# Patient Record
Sex: Female | Born: 1960 | Race: White | Hispanic: No | Marital: Married
Health system: Midwestern US, Community
[De-identification: ages and names within clinical notes are randomized; demographics above are authoritative.]

## PROBLEM LIST (undated history)

## (undated) DIAGNOSIS — I10 Essential (primary) hypertension: Secondary | ICD-10-CM

## (undated) DIAGNOSIS — J45909 Unspecified asthma, uncomplicated: Secondary | ICD-10-CM

## (undated) DIAGNOSIS — Z1239 Encounter for other screening for malignant neoplasm of breast: Secondary | ICD-10-CM

## (undated) DIAGNOSIS — J4530 Mild persistent asthma, uncomplicated: Principal | ICD-10-CM

## (undated) HISTORY — PX: TONSILLECTOMY: SUR1361

## (undated) HISTORY — PX: UTERINE FIBROID SURGERY: SHX826

---

## 2013-08-08 DIAGNOSIS — I10 Essential (primary) hypertension: Secondary | ICD-10-CM | POA: Insufficient documentation

## 2014-04-04 ENCOUNTER — Emergency Department (HOSPITAL_COMMUNITY)
Admission: EM | Admit: 2014-04-04 | Discharge: 2014-04-04 | Disposition: A | Discharge status: Home / Self Care | Payer: Self-pay | Attending: Emergency Medicine | Admitting: Emergency Medicine

## 2014-04-04 ENCOUNTER — Encounter (HOSPITAL_COMMUNITY): Payer: Self-pay | Admitting: Emergency Medicine

## 2014-04-04 ENCOUNTER — Emergency Department (HOSPITAL_COMMUNITY): Payer: Self-pay

## 2014-04-04 DIAGNOSIS — J45909 Unspecified asthma, uncomplicated: Secondary | ICD-10-CM | POA: Insufficient documentation

## 2014-04-04 DIAGNOSIS — R569 Unspecified convulsions: Secondary | ICD-10-CM

## 2014-04-04 DIAGNOSIS — I10 Essential (primary) hypertension: Secondary | ICD-10-CM | POA: Insufficient documentation

## 2014-04-04 DIAGNOSIS — Z3202 Encounter for pregnancy test, result negative: Secondary | ICD-10-CM | POA: Insufficient documentation

## 2014-04-04 DIAGNOSIS — G40909 Epilepsy, unspecified, not intractable, without status epilepticus: Secondary | ICD-10-CM | POA: Insufficient documentation

## 2014-04-04 DIAGNOSIS — Z79899 Other long term (current) drug therapy: Secondary | ICD-10-CM | POA: Insufficient documentation

## 2014-04-04 DIAGNOSIS — R51 Headache: Secondary | ICD-10-CM | POA: Insufficient documentation

## 2014-04-04 HISTORY — DX: Unspecified asthma, uncomplicated: J45.909

## 2014-04-04 HISTORY — DX: Essential (primary) hypertension: I10

## 2014-04-04 LAB — URINALYSIS, ROUTINE W REFLEX MICROSCOPIC
BILIRUBIN URINE: NEGATIVE
Glucose, UA: NEGATIVE mg/dL
HGB URINE DIPSTICK: NEGATIVE
KETONES UR: NEGATIVE mg/dL
Leukocytes, UA: NEGATIVE
NITRITE: NEGATIVE
Protein, ur: NEGATIVE mg/dL
SPECIFIC GRAVITY, URINE: 1.016 (ref 1.005–1.030)
UROBILINOGEN UA: 0.2 mg/dL (ref 0.0–1.0)
pH: 6 (ref 5.0–8.0)

## 2014-04-04 LAB — COMPREHENSIVE METABOLIC PANEL
ALT: 17 U/L (ref 0–35)
ANION GAP: 14 (ref 5–15)
AST: 20 U/L (ref 0–37)
Albumin: 4.1 g/dL (ref 3.5–5.2)
Alkaline Phosphatase: 90 U/L (ref 39–117)
BILIRUBIN TOTAL: 0.3 mg/dL (ref 0.3–1.2)
BUN: 12 mg/dL (ref 6–23)
CHLORIDE: 98 meq/L (ref 96–112)
CO2: 28 meq/L (ref 19–32)
Calcium: 9.5 mg/dL (ref 8.4–10.5)
Creatinine, Ser: 0.73 mg/dL (ref 0.50–1.10)
Glucose, Bld: 115 mg/dL — ABNORMAL HIGH (ref 70–99)
Potassium: 3.3 mEq/L — ABNORMAL LOW (ref 3.7–5.3)
SODIUM: 140 meq/L (ref 137–147)
Total Protein: 7.6 g/dL (ref 6.0–8.3)

## 2014-04-04 LAB — CBC WITH DIFFERENTIAL/PLATELET
Basophils Absolute: 0 10*3/uL (ref 0.0–0.1)
Basophils Relative: 0 % (ref 0–1)
EOS ABS: 0 10*3/uL (ref 0.0–0.7)
Eosinophils Relative: 0 % (ref 0–5)
HCT: 43.6 % (ref 36.0–46.0)
HEMOGLOBIN: 14.6 g/dL (ref 12.0–15.0)
LYMPHS ABS: 1 10*3/uL (ref 0.7–4.0)
LYMPHS PCT: 12 % (ref 12–46)
MCH: 28.7 pg (ref 26.0–34.0)
MCHC: 33.5 g/dL (ref 30.0–36.0)
MCV: 85.8 fL (ref 78.0–100.0)
MONOS PCT: 5 % (ref 3–12)
Monocytes Absolute: 0.4 10*3/uL (ref 0.1–1.0)
NEUTROS ABS: 6.7 10*3/uL (ref 1.7–7.7)
NEUTROS PCT: 83 % — AB (ref 43–77)
PLATELETS: 275 10*3/uL (ref 150–400)
RBC: 5.08 MIL/uL (ref 3.87–5.11)
RDW: 13.4 % (ref 11.5–15.5)
WBC: 8.2 10*3/uL (ref 4.0–10.5)

## 2014-04-04 LAB — CK: Total CK: 153 U/L (ref 7–177)

## 2014-04-04 LAB — RAPID URINE DRUG SCREEN, HOSP PERFORMED
Amphetamines: NOT DETECTED
BARBITURATES: NOT DETECTED
Benzodiazepines: NOT DETECTED
Cocaine: NOT DETECTED
Opiates: NOT DETECTED
TETRAHYDROCANNABINOL: NOT DETECTED

## 2014-04-04 LAB — POC URINE PREG, ED: Preg Test, Ur: NEGATIVE

## 2014-04-04 MED ORDER — MORPHINE SULFATE 4 MG/ML IJ SOLN
4.0000 mg | Freq: Once | INTRAMUSCULAR | Status: DC
  Filled 2014-04-04: qty 1

## 2014-04-04 MED ORDER — ONDANSETRON HCL 4 MG/2ML IJ SOLN
4.0000 mg | Freq: Once | INTRAMUSCULAR | Status: DC
  Filled 2014-04-04: qty 2

## 2014-04-04 NOTE — ED Notes (Signed)
Pt refused pain medication and nausea medicine. Pt states she is not in enough pain for that.

## 2014-04-04 NOTE — ED Provider Notes (Signed)
CSN: [id number]     Arrival date & time 04/04/14  1600 History   First MD Initiated Contact with Patient 04/04/14 1604     Chief Complaint  Patient presents with  . Seizures     (Consider location/radiation/quality/duration/timing/severity/associated sxs/prior Treatment) HPI Comments: Patient is a 53 year old female with history of hypertension and asthma who presents the emergency department after an episode of seizure-like activity. She reports that she was feeling well this morning and was going about her day normally. She was parked in a parking lot when she suddenly slumped over and had full body convulsions. This was witnessed by a friend. The episode lasted approximately 15 minutes. The patient then remained confused for approximately 15 minutes. She does not remember any details of the event. She reports that she remembers waking up in the ambulance and the EMS personnel was telling her that she was repeating herself. She currently reports a pressure-like headache at the front of her head. She denies any other symptoms. She denies chest pain, shortness of breath,  Dizziness, lightheadedness, numbness, weakness, speech difficulty. Nothing like this has happened to her in the past.  The history is provided by the patient. No language interpreter was used.    Past Medical History  Diagnosis Date  . Hypertension   . Asthma    Past Surgical History  Procedure Laterality Date  . Uterine fibroid surgery     History reviewed. No pertinent family history. History  Substance Use Topics  . Smoking status: Never Smoker   . Smokeless tobacco: Not on file  . Alcohol Use: Yes   OB History    No data available     Review of Systems  Constitutional: Negative for fever and chills.  Respiratory: Negative for shortness of breath.   Cardiovascular: Negative for chest pain.  Gastrointestinal: Negative for nausea, vomiting and abdominal pain.  Neurological: Positive for seizures and  headaches. Negative for tremors and weakness.  All other systems reviewed and are negative.     Allergies  Keflex and Macrolides and ketolides  Home Medications   Prior to Admission medications   Medication Sig Start Date End Date Taking? Authorizing Provider  losartan (COZAAR) 50 MG tablet Take 50 mg by mouth daily. 03/05/14  Yes Historical Provider, MD   BP 154/98 mmHg  Pulse 91  Temp(Src) 97.6 F (36.4 C) (Oral)  Resp 16  Ht 5\' 6"  (1.676 m)  Wt 191 lb (86.637 kg)  BMI 30.84 kg/m2  SpO2 99% Physical Exam  Constitutional: She is oriented to person, place, and time. She appears well-developed and well-nourished. No distress.  HENT:  Head: Normocephalic and atraumatic.  Right Ear: External ear normal.  Left Ear: External ear normal.  Nose: Nose normal.  Mouth/Throat: Oropharynx is clear and moist.  No broken or loose teeth No tongue laceration  Eyes: Conjunctivae and EOM are normal. Pupils are equal, round, and reactive to light.  Neck: Normal range of motion.  Cardiovascular: Normal rate, regular rhythm and normal heart sounds.   Pulmonary/Chest: Effort normal and breath sounds normal. No stridor. No respiratory distress. She has no wheezes. She has no rales.  Abdominal: Soft. She exhibits no distension. There is no tenderness.  Musculoskeletal: Normal range of motion.  Neurological: She is alert and oriented to person, place, and time. She has normal strength. GCS eye subscore is 4. GCS verbal subscore is 5. GCS motor subscore is 6.  Finger-nose-finger normal  normal gait Grip strength 5 out of 5 bilaterally  Skin: Skin is warm and dry. She is not diaphoretic. No erythema.  Psychiatric: She has a normal mood and affect. Her behavior is normal.  Nursing note and vitals reviewed.   ED Course  Procedures (including critical care time) Labs Review Labs Reviewed  CBC WITH DIFFERENTIAL - Abnormal; Notable for the following:    Neutrophils Relative % 83 (*)    All  other components within normal limits  COMPREHENSIVE METABOLIC PANEL - Abnormal; Notable for the following:    Potassium 3.3 (*)    Glucose, Bld 115 (*)    All other components within normal limits  URINALYSIS, ROUTINE W REFLEX MICROSCOPIC  URINE RAPID DRUG SCREEN (HOSP PERFORMED)  CK  POC URINE PREG, ED    Imaging Review Ct Head Wo Contrast  04/04/2014   CLINICAL DATA:  Confusion.  Seizures.  EXAM: CT HEAD WITHOUT CONTRAST  TECHNIQUE: Contiguous axial images were obtained from the base of the skull through the vertex without intravenous contrast.  COMPARISON:  None.  FINDINGS: The brainstem, cerebellum, cerebral peduncles, thalamus, basal ganglia, basilar cisterns, and ventricular system appear within normal limits. No intracranial hemorrhage, mass lesion, or acute CVA. Polypoid mucoperiosteal thickening of the right sphenoid sinus.  IMPRESSION: 1. Chronic right sphenoid sinusitis. Otherwise, no significant abnormalities are observed.   Electronically Signed   By: Herbie BaltimoreWalt  Liebkemann M.D.   On: 04/04/2014 17:45      EKG Interpretation   Date/Time:  Wednesday April 04 2014 16:17:05 EST Ventricular Rate:  112 PR Interval:  163 QRS Duration: 81 QT Interval:  338 QTC Calculation: 461 R Axis:   -22 Text Interpretation:  Sinus tachycardia LAE, consider biatrial enlargement  Borderline left axis deviation No old tracing to compare Confirmed by  Mirian MoGentry, Matthew [phone]([id number]) on 04/04/2014 4:22:43 PM      MDM   Final diagnoses:  Seizure-like activity   Patient presents to ED for seizure like activity. Labs are unremarkable. Head CT shows no acute abnormalities. Discussed case with Dr. Thad Rangereynolds. She reports labs are reassuring that this was not 15 minutes of seizure. Patient with full return to baseline. Will give neurology follow up as an outpatient. Discussed reasons to return to ED immediately. Vital signs stable for discharge. Discussed case with Dr. Littie DeedsGentry who agrees with plan. Patient /  Family / Caregiver informed of clinical course, understand medical decision-making process, and agree with plan.     Mora BellmanHannah S Kimberley Speece, PA-C 04/07/14 [id number]  Mirian MoMatthew Gentry, MD 04/10/14 [phone]

## 2014-04-04 NOTE — Discharge Instructions (Signed)
Do not drive until you have been cleared by an outpatient neurologist.  Seizure, Adult A seizure is abnormal electrical activity in the brain. Seizures usually last from 30 seconds to 2 minutes. There are various types of seizures. Before a seizure, you may have a warning sensation (aura) that a seizure is about to occur. An aura may include the following symptoms:   Fear or anxiety.  Nausea.  Feeling like the room is spinning (vertigo).  Vision changes, such as seeing flashing lights or spots. Common symptoms during a seizure include:  A change in attention or behavior (altered mental status).  Convulsions with rhythmic jerking movements.  Drooling.  Rapid eye movements.  Grunting.  Loss of bladder and bowel control.  Bitter taste in the mouth.  Tongue biting. After a seizure, you may feel confused and sleepy. You may also have an injury resulting from convulsions during the seizure. HOME CARE INSTRUCTIONS   If you are given medicines, take them exactly as prescribed by your health care provider.  Keep all follow-up appointments as directed by your health care provider.  Do not swim or drive or engage in risky activity during which a seizure could cause further injury to you or others until your health care provider says it is OK.  Get adequate rest.  Teach friends and family what to do if you have a seizure. They should:  Lay you on the ground to prevent a fall.  Put a cushion under your head.  Loosen any tight clothing around your neck.  Turn you on your side. If vomiting occurs, this helps keep your airway clear.  Stay with you until you recover.  Know whether or not you need emergency care. SEEK IMMEDIATE MEDICAL CARE IF:  The seizure lasts longer than 5 minutes.  The seizure is severe or you do not wake up immediately after the seizure.  You have an altered mental status after the seizure.  You are having more frequent or worsening  seizures. Someone should drive you to the emergency department or call local emergency services (911 in [place] MAKE SURE YOU:  Understand these instructions.  Will watch your condition.  Will get help right away if you are not doing well or get worse. Document Released: 05/08/2000 Document Revised: 03/01/2013 Document Reviewed: 12/21/2012 [facility] Patient Information 2015 [employer] [place]. This information is not intended to replace advice given to you by your health care provider. Make sure you discuss any questions you have with your health care provider.

## 2014-04-04 NOTE — ED Notes (Signed)
Pt was in car and slumped over, posturing- some shaking. Pt has no hx of seizures. EMS arrived, pt was fully responsive, pt confused, no neuro deficits otherwise. Pt repeating herself, forgetful, pt has no memory of the episode. Pt does not recall being in the EMS. Pt denies HA. BP 190/100-, HR 120, CBG 112. EKG sinus tach. Global ST depression per EMS. Pt now alert, forgetful, repeating information.

## 2014-05-09 ENCOUNTER — Ambulatory Visit (INDEPENDENT_AMBULATORY_CARE_PROVIDER_SITE_OTHER): Payer: No Typology Code available for payment source | Admitting: Neurology

## 2014-05-09 ENCOUNTER — Encounter: Payer: Self-pay | Admitting: Neurology

## 2014-05-09 VITALS — BP 120/76 | HR 67 | Resp 16 | Ht 66.0 in | Wt 194.0 lb

## 2014-05-09 DIAGNOSIS — R569 Unspecified convulsions: Secondary | ICD-10-CM

## 2014-05-09 NOTE — Patient Instructions (Addendum)
1. MRI brain with and without contrast, seizure protocol 2. Routine EEG 3. MRI Scheduled at [facility] on Tuesday 05/15/14 @ 1:45pm. Please arrive @ 1:15pm.           [address]  [phone]  Seizure Precautions: 1. If medication has been prescribed for you to prevent seizures, take it exactly as directed.  Do not stop taking the medicine without talking to your doctor first, even if you have not had a seizure in a long time.   2. Avoid activities in which a seizure would cause danger to yourself or to others.  Don't operate dangerous machinery, swim alone, or climb in high or dangerous places, such as on ladders, roofs, or girders.  Do not drive unless your doctor says you may.  3. If you have any warning that you may have a seizure, lay down in a safe place where you can't hurt yourself.    4.  No driving for 6 months from last seizure, as per [place] [place].   Please refer to the following link on the Epilepsy Foundation of America's website for more information: http://www.epilepsyfoundation.org/answerplace/Social/driving/drivingu.cfm   5.  Maintain good sleep hygiene.  6.  Contact your doctor if you have any problems that may be related to the medicine you are taking.  7.  Call 911 and bring the patient back to the ED if:        A.  The seizure lasts longer than 5 minutes.       B.  The patient doesn't awaken shortly after the seizure  C.  The patient has new problems such as difficulty seeing, speaking or moving  D.  The patient was injured during the seizure  E.  The patient has a temperature over 102 F (39C)  F.  The patient vomited and now is having trouble breathing

## 2014-05-09 NOTE — Progress Notes (Signed)
NEUROLOGY CONSULTATION NOTE  Destiny Carter MRN: [id number] DOB: 1960-06-25  Referring provider: Dr. Agapito GamesMichael Woodbridge Primary care provider: Dr. Agapito GamesMichael Woodbridge  Reason for consult:  New onset seizure  Dear Dr Faythe DingwallWoodbridge:  Thank you for your kind referral of Destiny Doomngela Baby for consultation of the above symptoms. Although her history is well known to you, please allow me to reiterate it for the purpose of our medical record. The patient was accompanied to the clinic by her husband who also provides collateral information. Records and images were personally reviewed where available.  HISTORY OF PRESENT ILLNESS: This is a very pleasant 53 year old right-handed woman with a history of hypertension, in her usual state of health until 04/04/2014 when she had a witnessed convulsion. She was sitting in a car parked waiting for a friend, speaking to her friend in Spanish (not her native language) when she could not think of the word she wanted to say. She has no further recollection of events, she was told that her eyes went back, face contorted, then she started having a generalized tonic-clonic seizure, banging her right leg on the door. This lasted for 2 minutes then she slumped to the left side. Her friend tried to pull her up then she started having another convulsion. No tongue bite or incontinence. When EMS arrived, she apparently got out of the car, grabbed her bags and walked into the ambulance, but has no recollection of this. She was told she was repeating herself in the ambulance and speaking in [place].  She recalls her right side was weaker after the seizure, but was unsure if this was due to swelling from hitting the door with her right side. Her whole body was sore and right ankle and left knee were swollen from hitting the car. [facility] where CBC, CMP, urine drug screen were unremarkable. I personally reviewed head CT which was normal.  She denies prior symptoms of  word-finding difficulties. She has occasional brief episodes of numbness over the right upper lip lasting a few seconds. She feels she may have had some lapses while driving the week prior, she would notice things passing too quickly or she would be staring at the mirror too long, such that she has avoided driving. Over the past month, she has had 3-4 episodes where she would have a dizzy spinning sensation where she feels she has to bear down. She denies any olfactory/gustatory hallucinations, deja vu, focal numbness/tingling/weakness, myoclonic jerks. Since the seizure, certain types of music give her a "creepy feeling," where she feels a sensation of movement like being in an elevator dropping. This lasts for a few seconds. She has also had brief episodes of nausea 1-2 times daily since the seizure.  She has been having sleeping difficulties for the past 3 months, but since the seizure sleep has improved. She has noticed short-term memory changes, she cannot recall names of people at church. 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 She has chronic neck pain. She wonders about menopausal relation, last period was 1-1/2 years ago. She is also concerned about eating moldy food the day prior to the seizure. She drinks wine 3-4 times a week.   Epilepsy Risk Factors:  She had a normal birth and early development.  There is no history of febrile convulsions, CNS infections such as  meningitis/encephalitis, significant traumatic brain injury, neurosurgical procedures, or family history of seizures.  Laboratory Data:  Lab Results  Component Value Date   WBC 8.2 04/04/2014   HGB 14.6 04/04/2014   HCT 43.6 04/04/2014   MCV 85.8 04/04/2014   PLT 275 04/04/2014     Chemistry      Component Value Date/Time   NA 140 04/04/2014 1659   K 3.3*  04/04/2014 1659   CL 98 04/04/2014 1659   CO2 28 04/04/2014 1659   BUN 12 04/04/2014 1659   CREATININE 0.73 04/04/2014 1659      Component Value Date/Time   CALCIUM 9.5 04/04/2014 1659   ALKPHOS 90 04/04/2014 1659   AST 20 04/04/2014 1659   ALT 17 04/04/2014 1659   BILITOT 0.3 04/04/2014 1659       PAST MEDICAL HISTORY: Past Medical History  Diagnosis Date  . Hypertension   . Asthma     PAST SURGICAL HISTORY: Past Surgical History  Procedure Laterality Date  . Uterine fibroid surgery    . Tonsillectomy      MEDICATIONS: Current Outpatient Prescriptions on File Prior to Visit  Medication Sig Dispense Refill  . diphenhydrAMINE (BENADRYL) 25 MG tablet Take 25 mg by mouth every 6 (six) hours as needed for itching or allergies.     No current facility-administered medications on file prior to visit.    ALLERGIES: Allergies  Allergen Reactions  . Other Anaphylaxis    Chili peppers, has epi pen  . Keflex [Cephalexin] Other (See Comments)  . Macrolides And Ketolides Itching and Rash  . Erythromycin Itching and Rash  . Latex Rash    FAMILY HISTORY: Family History  Problem Relation Age of Onset  . Cancer Father   . Cancer Mother     SOCIAL HISTORY: History   Social History  . Marital Status: Married    Spouse Name: N/A    Number of Children: N/A  . Years of Education: N/A   Occupational History  . Not on file.   Social History Main Topics  . Smoking status: Never Smoker   . Smokeless tobacco: Not on file  . Alcohol Use: Yes     Comment: 3-4 Times weekly  . Drug Use: No  . Sexual Activity: Not on file   Other Topics Concern  . Not on file   Social History Narrative    REVIEW OF SYSTEMS: Constitutional: No fevers, chills, or sweats, no generalized fatigue, change in appetite Eyes: No visual changes, double vision, eye pain Ear, nose and throat: No hearing loss, ear pain, nasal congestion, sore throat Cardiovascular: No chest pain,  palpitations Respiratory:  No shortness of breath at rest or with exertion, wheezes GastrointestinaI: No nausea, vomiting, diarrhea, abdominal pain, fecal incontinence Genitourinary:  No dysuria, urinary retention or frequency Musculoskeletal:  + neck pain,no back pain Integumentary: No rash, pruritus, skin lesions Neurological: as above Psychiatric: No depression, insomnia, anxiety Endocrine: No palpitations, fatigue, diaphoresis, mood swings, change in appetite, change in weight, increased thirst Hematologic/Lymphatic:  No anemia, purpura, petechiae. Allergic/Immunologic: no itchy/runny eyes, nasal congestion, recent allergic reactions, rashes  PHYSICAL EXAM: Filed Vitals:   05/09/14 1240  BP: 120/76  Pulse: 67  Resp: 16   General: No acute distress Head:  Normocephalic/atraumatic Eyes: Fundoscopic exam shows bilateral sharp discs, no vessel changes, exudates, or hemorrhages Neck: supple, no paraspinal tenderness, full range of motion Back: No paraspinal tenderness Heart: regular rate and rhythm Lungs: Clear to auscultation bilaterally. Vascular: No carotid bruits. Skin/Extremities:  No rash, no edema Neurological Exam: Mental status: alert and oriented to person, place, and time, no dysarthria or aphasia, Fund of knowledge is appropriate.  Recent and remote memory are intact.  Attention and concentration are normal.    Able to name objects and repeat phrases. Cranial nerves: CN I: not tested CN II: pupils equal, round and reactive to light, visual fields intact, fundi unremarkable. CN III, IV, VI:  full range of motion, no nystagmus, no ptosis CN V: facial sensation intact CN VII: upper and lower face symmetric CN VIII: hearing intact to finger rub CN IX, X: gag intact, uvula midline CN XI: sternocleidomastoid and trapezius muscles intact CN XII: tongue midline Bulk & Tone: normal, no fasciculations. Motor: 5/5 throughout with no pronator drift. Sensation: intact to light  touch, cold, pin, vibration and joint position sense.  No extinction to double simultaneous stimulation.  Romberg test negative Deep Tendon Reflexes: +2 throughout, no ankle clonus Plantar responses: downgoing bilaterally Cerebellar: no incoordination on finger to nose, heel to shin. No dysdiadochokinesia Gait: narrow-based and steady, able to tandem walk adequately. Tremor: none  IMPRESSION: This is a very pleasant 53 year old right-handed woman with a history of hypertension, with new onset seizure last 04/04/14. She started having word-finding difficulties followed by a convulsion. She recalls some right-sided weakness after. Seizure concerning for partial seizure arising from the left hemisphere. Her neurological exam is normal. MRI brain with and without contrast and routine EEG will be ordered to assess for focal abnormalities that increase risk for recurrent seizures. We discussed that after an initial seizure, unless there are significant risk factors, an abnormal neurological exam, an EEG showing epileptiform abnormalities, and/or abnormal neuroimaging, treatment with an antiepileptic drug is usually not indicated. We discussed 10% of the population may have a single seizure. Patients with a single unprovoked seizure have a recurrence rate of 33% after a single seizure and 73% after a second seizure. If routine EEG normal, she would benefit from prolonged EEG if recurrent symptoms of epigastric sensation and right facial numbness continue, to further classify her symptoms. We discussed [place] driving restrictions which indicate a patient needs to free of seizures or events of altered awareness for 6 months prior to resuming driving. The patient agreed to comply with these restrictions.  Seizure precautions were discussed which include no driving, no bathing in a tub, no swimming alone, no cooking over an open flame, no operating dangerous machinery, and no activities which may endanger oneself or  someone else. She will follow-up after the tests.   Thank you for allowing me to participate in the care of this patient. Please do not hesitate to call for any questions or concerns.   Patrcia DollyKaren Aquino, M.D.  CC: Dr. Faythe DingwallWoodbridge

## 2014-05-11 ENCOUNTER — Telehealth: Payer: Self-pay | Admitting: Neurology

## 2014-05-11 NOTE — Telephone Encounter (Signed)
Pt called/returnign your call from yesterday regarding her EEG results. Pt also wanted to make you aware that she is scheduled to have her MRI on 05/15/14 at 1:15 PM [facility].  C/b [phone]

## 2014-05-11 NOTE — Telephone Encounter (Signed)
Discussed EEG findings showing bilateral temporal slowing, L>R. No epileptiform discharges. Findings are non-specific, indicates focal cerebral dysfunction. Await MRI brain to assess for underlying structural abnormality. She had an episode last night while at church function where she felt a sensation of movement for 30 minutes, no confusion or focal symptoms. Recommend 24-hour EEG for further seizure classification. Will need prior approval per patient.  Tiff, can you pls sched for 24-hour EEG and see what she needs for approval of the test. Thanks!!

## 2014-05-11 NOTE — Telephone Encounter (Signed)
Left VM

## 2014-05-14 ENCOUNTER — Telehealth: Payer: Self-pay | Admitting: *Deleted

## 2014-05-14 ENCOUNTER — Other Ambulatory Visit: Payer: Self-pay | Admitting: Family Medicine

## 2014-05-14 DIAGNOSIS — R569 Unspecified convulsions: Secondary | ICD-10-CM

## 2014-05-14 NOTE — Telephone Encounter (Signed)
Authorization approval form was faxed to us & was put in for scanning.

## 2014-05-14 NOTE — Telephone Encounter (Signed)
Patient scheduled for 24 hour EEG on Wednesday. She would like for you to call her insurance carrier to ,ake sure this has been authorized prior to this testing done. Health care support [phone] Marylene Landngela [phone] Please advise

## 2014-05-14 NOTE — Telephone Encounter (Signed)
[facility] with Tacey RuizLeah. 24 Hour EEG was approved auth #: [id number].

## 2014-05-14 NOTE — Telephone Encounter (Signed)
Ordered placed, printed & taken up front for patient to be called & scheduled.

## 2014-05-15 ENCOUNTER — Ambulatory Visit: Payer: Self-pay | Admitting: Neurology

## 2014-05-15 ENCOUNTER — Encounter: Payer: Self-pay | Admitting: Neurology

## 2014-05-15 NOTE — Procedures (Signed)
ELECTROENCEPHALOGRAM REPORT  Date of Study: 05/09/2014  Patient's Name: Destiny Carter MRN: [id number] Date of Birth: [birth year]  Referring Provider: Dr. Patrcia DollyKaren Matilde Pottenger  Clinical History: This is a 53 year old woman with new onset seizure.  Medications: Cozaar  Technical Summary: A multichannel digital EEG recording measured by the international 10-20 system with electrodes applied with paste and impedances below 5000 ohms performed in our laboratory with EKG monitoring in an awake and asleep patient.  Hyperventilation and photic stimulation were performed.  The digital EEG was referentially recorded, reformatted, and digitally filtered in a variety of bipolar and referential montages for optimal display.    Description: The patient is awake and asleep during the recording.  During maximal wakefulness, there is a symmetric, medium voltage 10-10.5 Hz posterior dominant rhythm that attenuates with eye opening.  There is occasional focal 4-5 Hz theta slowing seen over the bilateral temporal regions, left greater than right, at times sharply contoured without clear epileptogenic potential. During drowsiness and stage I sleep, there is an increase in theta slowing of the background with occasional vertex waves seen.  Hyperventilation and photic stimulation did not elicit any epileptiform abnormalities, with note of increase focal slowing over the bilateral temporal regions.  There were no clear epileptiform discharges or electrographic seizures seen.    EKG lead was unremarkable.  Impression: This awake and asleep EEG is abnormal due to occasional focal slowing over the bilateral temporal regions, left greater than right.  Clinical Correlation of the above findings indicates focal cerebral dysfunction over the bilateral temporal regions suggestive of underlying structural or physiologic abnormality. The absence of epileptiform discharges does not exclude a clinical diagnosis of epilepsy.  If further  clinical questions remain, prolonged EEG may be helpful.  Clinical correlation is advised.   Patrcia DollyKaren Kaeleigh Westendorf, M.D.

## 2014-05-16 ENCOUNTER — Other Ambulatory Visit: Payer: No Typology Code available for payment source

## 2014-05-16 ENCOUNTER — Telehealth: Payer: Self-pay | Admitting: *Deleted

## 2014-05-16 NOTE — Telephone Encounter (Signed)
-----   Message from Van ClinesKaren M Aquino, MD sent at 05/16/2014  9:13 AM EST ----- Regarding: results Pls let her know I reviewed MRI brain and it is normal, no evidence of tumor, stroke, or bleed. Thanks

## 2014-05-16 NOTE — Telephone Encounter (Signed)
Patient notified

## 2014-05-19 ENCOUNTER — Encounter: Payer: Self-pay | Admitting: Neurology

## 2014-05-19 DIAGNOSIS — R569 Unspecified convulsions: Secondary | ICD-10-CM | POA: Insufficient documentation

## 2014-05-19 DIAGNOSIS — I10 Essential (primary) hypertension: Secondary | ICD-10-CM | POA: Insufficient documentation

## 2014-05-21 ENCOUNTER — Telehealth: Payer: Self-pay

## 2014-05-21 ENCOUNTER — Telehealth: Payer: Self-pay | Admitting: Neurology

## 2014-05-21 NOTE — Progress Notes (Signed)
Note faxed.

## 2014-05-21 NOTE — Telephone Encounter (Signed)
Returned call to Destiny Carter with code number and amount billed to insurance. Stated that is what we bill under that code, and that she will need to verify it is covered by her insurance.

## 2014-05-23 ENCOUNTER — Ambulatory Visit (INDEPENDENT_AMBULATORY_CARE_PROVIDER_SITE_OTHER): Payer: No Typology Code available for payment source | Admitting: Neurology

## 2014-05-23 DIAGNOSIS — R569 Unspecified convulsions: Secondary | ICD-10-CM

## 2014-05-28 ENCOUNTER — Telehealth: Payer: Self-pay | Admitting: Neurology

## 2014-05-28 NOTE — Telephone Encounter (Signed)
Discussed EEG results again showing bilateral temporal slowing, left greater than right. No epileptiform discharges. Discussed options, close clinical monitoring off medication versus starting AED (either Keppra or Lamictal, side effects discussed). Patient asked several questions regarding EEG changes, all questions answered. She opts to hold off on AED at this time. Avoid seizure triggers, including sleep deprivation and alcohol. F/u as scheduled.

## 2014-05-28 NOTE — Procedures (Signed)
ELECTROENCEPHALOGRAM REPORT  Dates of Recording: 05/23/2014 to 05/24/2014  Patient's Name: Destiny Carter MRN: [id number] Date of Birth: [birth year]  Referring Provider: Dr. Patrcia Dolly  Procedure: 24-hour ambulatory EEG  History: This is a 54 year old woman with new onset seizure  Medications: Cozaar  Technical Summary: This is a 24-hour multichannel digital EEG recording measured by the international 10-20 system with electrodes applied with paste and impedances below 5000 ohms performed as portable with EKG monitoring.  The digital EEG was referentially recorded, reformatted, and digitally filtered in a variety of bipolar and referential montages for optimal display.    DESCRIPTION OF RECORDING: During maximal wakefulness, the background activity consisted of a symmetric 10 Hz posterior dominant rhythm which was reactive to eye opening.  There was occasional focal 4-5 Hz theta slowing seen over the bilateral temporal regions, left greater than right, at times sharply contoured without clear epileptogenic potential. There were no clear epileptiform discharges seen in wakefulness.  During the recording, the patient progresses through wakefulness, drowsiness, and Stage 2 sleep.  Similar occasional focal slowing was seen over the bilateral temporal regions, left greater than right. Again, there were no epileptiform discharges seen.  Events: There were no push button events.   There were no electrographic seizures seen.  EKG lead was unremarkable.  IMPRESSION: This 24-hour ambulatory EEG study is abnormal due to occasional focal slowing over the bilateral temporal regions, left greater than right.  CLINICAL CORRELATION of the above findings indicates focal cerebral dysfunction over the bilateral temporal regions  suggestive of underlying structural or physiologic abnormality. The absence of epileptiform discharges does not exclude a  clinical diagnosis of epilepsy. Clinical correlation  is advised.   Patrcia Dolly, M.D.

## 2014-06-05 ENCOUNTER — Encounter: Payer: Self-pay | Admitting: Neurology

## 2014-06-11 ENCOUNTER — Ambulatory Visit (INDEPENDENT_AMBULATORY_CARE_PROVIDER_SITE_OTHER): Payer: No Typology Code available for payment source | Admitting: Neurology

## 2014-06-11 ENCOUNTER — Encounter: Payer: Self-pay | Admitting: Neurology

## 2014-06-11 VITALS — BP 128/90 | HR 70 | Resp 16 | Ht 66.0 in | Wt 196.0 lb

## 2014-06-11 DIAGNOSIS — R569 Unspecified convulsions: Secondary | ICD-10-CM

## 2014-06-11 NOTE — Patient Instructions (Addendum)
1. Call our office for any change in symptoms, follow-up in 2 months  Seizure Precautions: 1. If medication has been prescribed for you to prevent seizures, take it exactly as directed.  Do not stop taking the medicine without talking to your doctor first, even if you have not had a seizure in a long time.   2. Avoid activities in which a seizure would cause danger to yourself or to others.  Don't operate dangerous machinery, swim alone, or climb in high or dangerous places, such as on ladders, roofs, or girders.  Do not drive unless your doctor says you may.  3. If you have any warning that you may have a seizure, lay down in a safe place where you can't hurt yourself.    4.  No driving for 6 months from last seizure, [place].   Please refer to the following link on the Epilepsy Foundation of America's website for more information: http://www.epilepsyfoundation.org/answerplace/Social/driving/drivingu.cfm   5.  Maintain good sleep hygiene.  6.  Contact your doctor if you have any problems that may be related to the medicine you are taking.  7.  Call 911 and bring the patient back to the ED if:        A.  The seizure lasts longer than 5 minutes.       B.  The patient doesn't awaken shortly after the seizure  C.  The patient has new problems such as difficulty seeing, speaking or moving  D.  The patient was injured during the seizure  E.  The patient has a temperature over 102 F (39C)  F.  The patient vomited and now is having trouble breathing

## 2014-06-11 NOTE — Progress Notes (Signed)
NEUROLOGY FOLLOW UP OFFICE NOTE  Destiny Carter [id number]  HISTORY OF PRESENT ILLNESS: I had the pleasure of seeing Destiny Carter in follow-up in the neurology clinic on 06/11/2014.  The patient was last seen a month ago for new onset seizure. Records and images were personally reviewed where available.  I personally reviewed MRI brain with and without contrast which was normal, hippocampi symmetric. Her routine and 24-hour EEG showed occasional focal slowing over the bilateral temporal regions, left greater than right, no epileptiform discharges seen. Findings were discussed over the phone, we had discussed starting seizure medication versus close observation, and she opted to stay off medication. She denies any further seizures or seizure-like symptoms. No further speech difficulties, dizziness, or right-sided tingling.   HPI: This is a very pleasant 54 yo RH woman with a history of hypertension, in her usual state of health until 04/04/2014 when she had a witnessed convulsion. She was sitting in a car parked waiting for a friend, speaking to her friend in Spanish (not her native language) when she could not think of the word she wanted to say. She has no further recollection of events, she was told that her eyes went back, face contorted, then she started having a generalized tonic-clonic seizure, banging her right leg on the door. This lasted for 2 minutes then she slumped to the left side. Her friend tried to pull her up then she started having another convulsion. No tongue bite or incontinence. When EMS arrived, she apparently got out of the car, grabbed her bags and walked into the ambulance, but has no recollection of this. She was told she was repeating herself in the ambulance and speaking in [place]. She recalls her right side was weaker after the seizure, but was unsure if this was due to swelling from hitting the door with her right side. Her whole body was sore and right ankle and left knee  were swollen from hitting the car. She was brought to [facility] ER where CBC, CMP, urine drug screen were unremarkable. I personally reviewed head CT which was normal.  Epilepsy Risk Factors: She had a normal birth and early development. There is no history of febrile convulsions, CNS infections such as meningitis/encephalitis, significant traumatic brain injury, neurosurgical procedures, or family history of seizures.  PAST MEDICAL HISTORY: Past Medical History  Diagnosis Date  . Hypertension   . Asthma     MEDICATIONS: Current Outpatient Prescriptions on File Prior to Visit  Medication Sig Dispense Refill  . diphenhydrAMINE (BENADRYL) 25 MG tablet Take 25 mg by mouth every 6 (six) hours as needed for itching or allergies.    Marland Kitchen EPIPEN 2-PAK 0.3 MG/0.3ML SOAJ injection PRN  3  . levalbuterol (XOPENEX HFA) 45 MCG/ACT inhaler Inhale into the lungs. Every 4 hrs prn    . losartan (COZAAR) 100 MG tablet Take 100 mg by mouth. Take 1 tablet daily    . MULTIPLE VITAMIN PO Take by mouth daily.     No current facility-administered medications on file prior to visit.    ALLERGIES: Allergies  Allergen Reactions  . Other Anaphylaxis    Chili peppers, has epi pen  . Keflex [Cephalexin] Other (See Comments)  . Macrolides And Ketolides Itching and Rash  . Erythromycin Itching and Rash  . Latex Rash    FAMILY HISTORY: Family History  Problem Relation Age of Onset  . Cancer Father   . Cancer Mother     SOCIAL HISTORY: History   Social History  .  Marital Status: Married    Spouse Name: N/A    Number of Children: N/A  . Years of Education: N/A   Occupational History  . Not on file.   Social History Main Topics  . Smoking status: Never Smoker   . Smokeless tobacco: Not on file  . Alcohol Use: Yes     Comment: 3-4 Times weekly  . Drug Use: No  . Sexual Activity: Not on file   Other Topics Concern  . Not on file   Social History Narrative    REVIEW OF SYSTEMS: Constitutional:  No fevers, chills, or sweats, no generalized fatigue, change in appetite Eyes: No visual changes, double vision, eye pain Ear, nose and throat: No hearing loss, ear pain, nasal congestion, sore throat Cardiovascular: No chest pain, palpitations Respiratory:  No shortness of breath at rest or with exertion, wheezes GastrointestinaI: No nausea, vomiting, diarrhea, abdominal pain, fecal incontinence Genitourinary:  No dysuria, urinary retention or frequency Musculoskeletal:  No neck pain, back pain Integumentary: No rash, pruritus, skin lesions Neurological: as above Psychiatric: No depression, insomnia, anxiety Endocrine: No palpitations, fatigue, diaphoresis, mood swings, change in appetite, change in weight, increased thirst Hematologic/Lymphatic:  No anemia, purpura, petechiae. Allergic/Immunologic: no itchy/runny eyes, nasal congestion, recent allergic reactions, rashes  PHYSICAL EXAM: Filed Vitals:   06/11/14 1029  BP: 128/90  Pulse: 70  Resp: 16   General: No acute distress Head:  Normocephalic/atraumatic Neck: supple, no paraspinal tenderness, full range of motion Heart:  Regular rate and rhythm Lungs:  Clear to auscultation bilaterally Back: No paraspinal tenderness Skin/Extremities: No rash, no edema Neurological Exam: alert and oriented to person, place, and time. No aphasia or dysarthria. Fund of knowledge is appropriate.  Recent and remote memory are intact.  3/3 delayed recall. Attention and concentration are normal.    Able to name objects and repeat phrases. Cranial nerves: Pupils equal, round, reactive to light.  Fundoscopic exam unremarkable, no papilledema. Extraocular movements intact with no nystagmus. Visual fields full. Facial sensation intact. No facial asymmetry. Tongue, uvula, palate midline.  Motor: Bulk and tone normal, muscle strength 5/5 throughout with no pronator drift.  Sensation to light touch intact.  No extinction to double simultaneous stimulation.  Deep  tendon reflexes 2+ throughout, toes downgoing.  Finger to nose testing intact.  Gait narrow-based and steady, able to tandem walk adequately.  Romberg negative.  IMPRESSION: This is a very pleasant 54 yo RH woman with a history of hypertension, with new onset seizure last 04/04/14. She started having word-finding difficulties followed by a convulsion. She recalls some right-sided weakness after. Seizure concerning for partial seizure arising from the left hemisphere. Her neurological exam is normal. MRI brain normal. Her routine and 24-hour EEG show occasional focal slowing over the bilateral temporal regions, left greater than right, no epileptiform discharges. Although EEG did not show epileptiform discharges, with the history suggestive of partial onset seizure possibly from the left hemisphere, I discussed the option of starting anti-seizure medication. We discussed options, including Lamictal and Keppra, as well as side effects. She would like to hold off for now and continue to monitor symptoms. She is aware of [place] driving restrictions which indicate a patient needs to free of seizures or events of altered awareness for 6 months prior to resuming driving. She had several questions regarding the diagnosis, test results, and prognosis, which were all answered today. She will follow-up in 2 months and knows to call our office for any change in symptoms.   Thank  you for allowing me to participate in her care.  Please do not hesitate to call for any questions or concerns.  The duration of this appointment visit was 25 minutes of face-to-face time with the patient.  Greater than 50% of this time was spent in counseling, explanation of diagnosis, planning of further management, and coordination of care.   Patrcia DollyKaren Malasia Torain, M.D.   CC: Dr. Faythe DingwallWoodbridge

## 2014-06-16 ENCOUNTER — Encounter: Payer: Self-pay | Admitting: Neurology

## 2014-06-18 NOTE — Progress Notes (Signed)
Done

## 2014-07-03 NOTE — Telephone Encounter (Signed)
Her appointment was changed to a more convenient time for her and all the information for insurance was given to her this visit was finished successfully and to the patient's satisfaction.

## 2014-08-06 ENCOUNTER — Ambulatory Visit: Payer: No Typology Code available for payment source | Admitting: Neurology

## 2014-08-13 ENCOUNTER — Telehealth: Payer: Self-pay | Admitting: Neurology

## 2014-08-13 NOTE — Telephone Encounter (Signed)
I spoke with patient. She has to have a tooth extracted, she wanted to make sure it would ok for her to be put under with the hx of the seizure she had. Per Dr. Karel JarvisAquino it's ok for her to have dental procedure as planned. She will call us back to get her follow-up rescheduled, which she had to cancel last week.

## 2014-08-13 NOTE — Telephone Encounter (Signed)
Pt called wanting to ask Dr. Allena KatzPatel a questions...the patient saids that she needs to have dental surgery and wants to see if she can have it done. C/b [phone]

## 2014-10-02 ENCOUNTER — Ambulatory Visit (INDEPENDENT_AMBULATORY_CARE_PROVIDER_SITE_OTHER): Payer: No Typology Code available for payment source | Admitting: Neurology

## 2014-10-02 ENCOUNTER — Encounter: Payer: Self-pay | Admitting: Neurology

## 2014-10-02 VITALS — BP 124/86 | HR 72 | Resp 16 | Ht 66.0 in | Wt 197.0 lb

## 2014-10-02 DIAGNOSIS — G40009 Localization-related (focal) (partial) idiopathic epilepsy and epileptic syndromes with seizures of localized onset, not intractable, without status epilepticus: Secondary | ICD-10-CM

## 2014-10-02 MED ORDER — LAMOTRIGINE ER 25 MG PO TB24: ORAL_TABLET | ORAL | Status: DC

## 2014-10-02 NOTE — Patient Instructions (Signed)
1. Start Lamotrigine ER 25mg : take 1 tablet daily for 2 weeks, then increase to 2 tablets daily for 2 weeks, then increase to 4 tablets daily 2. Call our office for any problems 3. Follow-up in 6 weeks  Seizure Precautions: 1. If medication has been prescribed for you to prevent seizures, take it exactly as directed.  Do not stop taking the medicine without talking to your doctor first, even if you have not had a seizure in a long time.   2. Avoid activities in which a seizure would cause danger to yourself or to others.  Don't operate dangerous machinery, swim alone, or climb in high or dangerous places, such as on ladders, roofs, or girders.  Do not drive unless your doctor says you may.  3. If you have any warning that you may have a seizure, lay down in a safe place where you can't hurt yourself.    4.  No driving for 6 months from last seizure, [place].   Please refer to the following link on the Epilepsy Foundation of America's website for more information: http://www.epilepsyfoundation.org/answerplace/Social/driving/drivingu.cfm   5.  Maintain good sleep hygiene.  6.  Contact your doctor if you have any problems that may be related to the medicine you are taking.  7.  Call 911 and bring the patient back to the ED if:        A.  The seizure lasts longer than 5 minutes.       B.  The patient doesn't awaken shortly after the seizure  C.  The patient has new problems such as difficulty seeing, speaking or moving  D.  The patient was injured during the seizure  E.  The patient has a temperature over 102 F (39C)  F.  The patient vomited and now is having trouble breathing

## 2014-10-02 NOTE — Progress Notes (Signed)
NEUROLOGY FOLLOW UP OFFICE NOTE  Destiny Carter [id number]  HISTORY OF PRESENT ILLNESS: I had the pleasure of seeing Destiny Carter in follow-up in the neurology clinic on 10/02/2014.  The patient was last seen 4 months ago and is again accompanied by her husband today. Since her last visit, she has had another seizure last 09/12/2014. She had decided to hold off on starting anti-epileptic medication after the first seizure. Her MRI brain was normal, however 24-hour EEG had shown bilateral temporal slowing, left>right, no epileptiform discharges. She states that she was feeling well, sitting in the passenger seat of a car, when she stopped talking mid-sentence and turned to her husband, stating "I am going to have a seizure." She felt she could not verbalize, and saw bright lights like she was passing through trees, then lost awareness. Her husband reports she became quiet, then moaned, her right arm went straight up, followed by a generalized convulsion lasting 5 minutes. She was drooling after and not making sense. No focal weakness noted, she walked out of the car. When she regained awareness, she had a terrible headache and felt very tired and sleepy. She is concerned about itching on her upper left lip the day prior that lasted the whole day. Last weekend, she had 2 episodes of a brief electric shock sensation with numbness down her left side, "felt like a cattle prod," lasting 2-3 seconds, no associated confusion. Since the seizure, she feels slow and feels her memory has worsened.  She denies any further headaches. No dizziness, focal numbness/tingling/weakness, olfactory/gustatory hallucinations. She has some eczema in the back of her neck (plaque-like lesions).   HPI: This is a very pleasant 54 yo RH woman with a history of hypertension, in her usual state of health until 04/04/2014 when she had a witnessed convulsion. She was sitting in a car parked waiting for a friend, speaking to her friend  in Spanish (not her native language) when she could not think of the word she wanted to say. She has no further recollection of events, she was told that her eyes went back, face contorted, then she started having a generalized tonic-clonic seizure, banging her right leg on the door. This lasted for 2 minutes then she slumped to the left side. Her friend tried to pull her up then she started having another convulsion. No tongue bite or incontinence. When EMS arrived, she apparently got out of the car, grabbed her bags and walked into the ambulance, but has no recollection of this. She was told she was repeating herself in the ambulance and speaking in [place]. She recalls her right side was weaker after the seizure, but was unsure if this was due to swelling from hitting the door with her right side. Her whole body was sore and right ankle and left knee were swollen from hitting the car. She was brought to [facility] ER where CBC, CMP, urine drug screen were unremarkable. I personally reviewed head CT which was normal.  Epilepsy Risk Factors: She had a normal birth and early development. There is no history of febrile convulsions, CNS infections such as meningitis/encephalitis, significant traumatic brain injury, neurosurgical procedures, or family history of seizures.  Diagnostic data: I personally reviewed MRI brain with and without contrast which was normal, hippocampi symmetric. Her routine and 24-hour EEG showed occasional focal slowing over the bilateral temporal regions, left greater than right, no epileptiform discharges seen. Typical events not captured.  PAST MEDICAL HISTORY: Past Medical History  Diagnosis Date  .  Hypertension   . Asthma     MEDICATIONS: Current Outpatient Prescriptions on File Prior to Visit  Medication Sig Dispense Refill  . diphenhydrAMINE (BENADRYL) 25 MG tablet Take 25 mg by mouth every 6 (six) hours as needed for itching or allergies.    Marland Kitchen. levalbuterol (XOPENEX HFA) 45  MCG/ACT inhaler Inhale into the lungs. Every 4 hrs prn    . losartan (COZAAR) 100 MG tablet Take 100 mg by mouth. Take 1 tablet daily    . MULTIPLE VITAMIN PO Take by mouth daily.    Marland Kitchen. EPIPEN 2-PAK 0.3 MG/0.3ML SOAJ injection PRN  3   No current facility-administered medications on file prior to visit.    ALLERGIES: Allergies  Allergen Reactions  . Other Anaphylaxis    Chili peppers, has epi pen  . Keflex [Cephalexin] Other (See Comments)  . Macrolides And Ketolides Itching and Rash  . Erythromycin Itching and Rash  . Latex Rash    FAMILY HISTORY: Family History  Problem Relation Age of Onset  . Cancer Father   . Cancer Mother     SOCIAL HISTORY: History   Social History  . Marital Status: Married    Spouse Name: N/A  . Number of Children: N/A  . Years of Education: N/A   Occupational History  . Not on file.   Social History Main Topics  . Smoking status: Never Smoker   . Smokeless tobacco: Not on file  . Alcohol Use: Yes     Comment: 3-4 Times weekly  . Drug Use: No  . Sexual Activity: Not on file   Other Topics Concern  . Not on file   Social History Narrative    REVIEW OF SYSTEMS: Constitutional: No fevers, chills, or sweats, no generalized fatigue, change in appetite Eyes: No visual changes, double vision, eye pain Ear, nose and throat: No hearing loss, ear pain, nasal congestion, sore throat Cardiovascular: No chest pain, palpitations Respiratory:  No shortness of breath at rest or with exertion, wheezes GastrointestinaI: No nausea, vomiting, diarrhea, abdominal pain, fecal incontinence Genitourinary:  No dysuria, urinary retention or frequency Musculoskeletal:  No neck pain, back pain Integumentary: No rash, pruritus, skin lesions Neurological: as above Psychiatric: No depression, insomnia, anxiety Endocrine: No palpitations, fatigue, diaphoresis, mood swings, change in appetite, change in weight, increased thirst Hematologic/Lymphatic:  No  anemia, purpura, petechiae. Allergic/Immunologic: no itchy/runny eyes, nasal congestion, recent allergic reactions, rashes  PHYSICAL EXAM: Filed Vitals:   10/02/14 0959  BP: 124/86  Pulse: 72  Resp: 16   General: No acute distress Head:  Normocephalic/atraumatic Neck: supple, no paraspinal tenderness, full range of motion Heart:  Regular rate and rhythm Lungs:  Clear to auscultation bilaterally Back: No paraspinal tenderness Skin/Extremities: No rash, no edema Neurological Exam: alert and oriented to person, place, and time. No aphasia or dysarthria. Fund of knowledge is appropriate.  Recent and remote memory are intact. 3/3 delayed recall.  Attention and concentration are normal.    Able to name objects and repeat phrases. Cranial nerves: Pupils equal, round, reactive to light.  Fundoscopic exam unremarkable, no papilledema. Extraocular movements intact with no nystagmus. Visual fields full. Facial sensation intact. No facial asymmetry. Tongue, uvula, palate midline.  Motor: Bulk and tone normal, muscle strength 5/5 throughout with no pronator drift.  Sensation to light touch intact.  No extinction to double simultaneous stimulation.  Deep tendon reflexes 2+ throughout, toes downgoing.  Finger to nose testing intact.  Gait narrow-based and steady, able to tandem walk adequately.  Romberg negative.  IMPRESSION: This is a very pleasant 54 yo RH woman with a history of hypertension, with new onset seizure last 04/04/14. She started having word-finding difficulties followed by a convulsion. She recalls some right-sided weakness after. Seizure concerning for partial seizure arising from the left hemisphere. Her neurological exam and MRI brain are normal. Her routine and 24-hour EEG show occasional focal slowing over the bilateral temporal regions, left greater than right, no epileptiform discharges. Although EEG did not show epileptiform discharges, with the history suggestive of partial onset  seizure possibly from the left hemisphere, I discussed the option of starting anti-seizure medication. She decided to hold off after the first seizure, however after this second seizure last 09/22/14 again with speech arrest and right arm extension, she is agreeable to starting AED. She will start Lamictal uptitration, side effects, including risk of Levonne Spiller syndrome was discussed. She is aware of  driving laws to stop driving until 6 months seizure-free. She will follow-up in 6 weeks and knows to call our office for any changes.   Thank you for allowing me to participate in her care.  Please do not hesitate to call for any questions or concerns.  The duration of this appointment visit was 25 minutes of face-to-face time with the patient.  Greater than 50% of this time was spent in counseling, explanation of diagnosis, planning of further management, and coordination of care.   Patrcia Dolly, M.D.   CC: Dr. Faythe Dingwall

## 2014-10-06 DIAGNOSIS — G40009 Localization-related (focal) (partial) idiopathic epilepsy and epileptic syndromes with seizures of localized onset, not intractable, without status epilepticus: Secondary | ICD-10-CM | POA: Insufficient documentation

## 2014-11-19 ENCOUNTER — Ambulatory Visit (INDEPENDENT_AMBULATORY_CARE_PROVIDER_SITE_OTHER): Payer: No Typology Code available for payment source | Admitting: Neurology

## 2014-11-19 ENCOUNTER — Encounter: Payer: Self-pay | Admitting: Neurology

## 2014-11-19 VITALS — BP 122/82 | HR 76 | Resp 16 | Ht 66.0 in | Wt 194.0 lb

## 2014-11-19 DIAGNOSIS — G40009 Localization-related (focal) (partial) idiopathic epilepsy and epileptic syndromes with seizures of localized onset, not intractable, without status epilepticus: Secondary | ICD-10-CM

## 2014-11-19 MED ORDER — LAMOTRIGINE ER 100 MG PO TB24
ORAL_TABLET | ORAL | Status: DC
Start: 2014-11-19 — End: 2015-01-21

## 2014-11-19 NOTE — Progress Notes (Signed)
NEUROLOGY FOLLOW UP OFFICE NOTE  Destiny Carter [id number]  HISTORY OF PRESENT ILLNESS: I had the pleasure of seeing Destiny Carter in follow-up in the neurology clinic on 11/19/2014.  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 She is again accompanied by her husband who supplements the history today. She has had 2 seizures, most recently on 09/12/14. She is now on Lamictal XR /day with no side effects. She has several concerns which we went through. She has noticed some redness in her cheeks and chin, non-pruritic, no rash over the chest/arms/trunk/abdomen. She has had 2 episodes of memory loss, 3 weeks ago they were sitting and talking in the parking lot of [employer], then later on asked when they were going to [employer]. A week ago, she handed a friend the pencil she asked for, and her friend told her she just gave it to her. She had no recall that these had previously occurred until she was reminded of them later. Her husband denied any confusion or speech difficulties while they were sitting in the car. He denies any staring or unresponsive episodes, she denies any speech difficulties. She denies any headaches, dizziness, diplopia, focal numbness/tingling/weakness. She feels her balance is off, no falls.   HPI: This is a very pleasant 54 yo RH woman with a history of hypertension, in her usual state of health until 04/04/2014 when she had a witnessed convulsion. She was sitting in a car parked waiting for a friend, speaking to her friend in Spanish (not her native language) when she could not think of the word she wanted to say. She has no further recollection of events, she was told that her eyes went back, face contorted, then she started having a generalized tonic-clonic seizure, banging her right leg on the door. This lasted for 2 minutes then she slumped to the left side. Her friend tried to pull her up then she started  having another convulsion. No tongue bite or incontinence. When EMS arrived, she apparently got out of the car, grabbed her bags and walked into the ambulance, but has no recollection of this. She was told she was repeating herself in the ambulance and speaking in [place]. She recalls her right side was weaker after the seizure, but was unsure if this was due to swelling from hitting the door with her right side. Her whole body was sore and right ankle and left knee were swollen from hitting the car. She was brought to [facility] ER where CBC, CMP, urine drug screen were unremarkable. I personally reviewed head CT which was normal. She wanted to hold off on medication at that time.  She had a second seizure on 09/12/14. She states that she was feeling well, sitting in the passenger seat of a car, when she stopped talking mid-sentence and turned to her husband, stating "I am going to have a seizure." She felt she could not verbalize, and saw bright lights like she was passing through trees, then lost awareness. Her husband reports she became quiet, then moaned, her right arm went straight up, followed by a generalized convulsion lasting 5 minutes. She was drooling after and not making sense. No focal weakness noted, she walked out of the car. When she regained awareness, she had a terrible headache and felt very tired and sleepy. She started Lamictal XR.  Epilepsy Risk Factors: She had a normal birth and early development. There is no history of febrile convulsions, CNS infections  such as meningitis/encephalitis, significant traumatic brain injury, neurosurgical procedures, or family history of seizures.  Diagnostic data: I personally reviewed MRI brain with and without contrast which was normal, hippocampi symmetric. Her routine and 24-hour EEG showed occasional focal slowing over the bilateral temporal regions, left greater than right, no epileptiform discharges seen. Typical events not captured.  PAST MEDICAL  HISTORY: Past Medical History  Diagnosis Date  . Hypertension   . Asthma     MEDICATIONS: Current Outpatient Prescriptions on File Prior to Visit  Medication Sig Dispense Refill  . aspirin 81 MG tablet Take 81 mg by mouth daily.    . Cholecalciferol (VITAMIN D3) 1000 UNITS CAPS Take by mouth daily.    . Collagen-Vitamin C (COLLAGEN PLUS VITAMIN C PO) Take by mouth daily.    . LamoTRIgine (LAMICTAL XR) 25 MG TB24 tablet Take 1 tablet daily for 2 weeks, then increase to 2 tablets daily for 2 weeks, then increase to 4 tablets daily (Patient taking differently: Take 4 tablets daily) 98 tablet 1  . losartan (COZAAR) 100 MG tablet Take 100 mg by mouth. Take 1 tablet daily    . EPIPEN 2-PAK 0.3 MG/0.3ML SOAJ injection PRN  3  . levalbuterol (XOPENEX HFA) 45 MCG/ACT inhaler Inhale into the lungs. Every 4 hrs prn     No current facility-administered medications on file prior to visit.    ALLERGIES: Allergies  Allergen Reactions  . Other Anaphylaxis    Chili peppers, has epi pen  . Keflex [Cephalexin] Other (See Comments)  . Macrolides And Ketolides Itching and Rash  . Erythromycin Itching and Rash  . Latex Rash    FAMILY HISTORY: Family History  Problem Relation Age of Onset  . Cancer Father   . Cancer Mother     SOCIAL HISTORY: History   Social History  . Marital Status: Married    Spouse Name: N/A  . Number of Children: N/A  . Years of Education: N/A   Occupational History  . Not on file.   Social History Main Topics  . Smoking status: Never Smoker   . Smokeless tobacco: Not on file  . Alcohol Use: Yes     Comment: 3-4 Times weekly  . Drug Use: No  . Sexual Activity: Not on file   Other Topics Concern  . Not on file   Social History Narrative    REVIEW OF SYSTEMS: Constitutional: No fevers, chills, or sweats, no generalized fatigue, change in appetite Eyes: No visual changes, double vision, eye pain Ear, nose and throat: No hearing loss, ear pain, nasal  congestion, sore throat Cardiovascular: No chest pain, palpitations Respiratory:  No shortness of breath at rest or with exertion, wheezes GastrointestinaI: No nausea, vomiting, diarrhea, abdominal pain, fecal incontinence Genitourinary:  No dysuria, urinary retention or frequency Musculoskeletal:  No neck pain, back pain Integumentary: No rash, pruritus, skin lesions Neurological: as above Psychiatric: No depression, insomnia, anxiety Endocrine: No palpitations, fatigue, diaphoresis, mood swings, change in appetite, change in weight, increased thirst Hematologic/Lymphatic:  No anemia, purpura, petechiae. Allergic/Immunologic: no itchy/runny eyes, nasal congestion, recent allergic reactions, rashes  PHYSICAL EXAM: Filed Vitals:   11/19/14 1055  BP: 122/82  Pulse: 76  Resp: 16   General: No acute distress Head:  Normocephalic/atraumatic Neck: supple, no paraspinal tenderness, full range of motion Heart:  Regular rate and rhythm Lungs:  Clear to auscultation bilaterally Back: No paraspinal tenderness Skin/Extremities: No rash, no edema Neurological Exam: alert and oriented to person, place, and time. No aphasia  or dysarthria. Fund of knowledge is appropriate.  Recent and remote memory are intact. 3/3 delayed recall.  Attention and concentration are normal.    Able to name objects and repeat phrases. Cranial nerves: Pupils equal, round, reactive to light.  Fundoscopic exam unremarkable, no papilledema. Extraocular movements intact with no nystagmus. Visual fields full. Facial sensation intact. No facial asymmetry. Tongue, uvula, palate midline.  Motor: Bulk and tone normal, muscle strength 5/5 throughout with no pronator drift.  Sensation to light touch intact.  No extinction to double simultaneous stimulation.  Deep tendon reflexes 2+ throughout, toes downgoing.  Finger to nose testing intact.  Gait narrow-based and steady, able to tandem walk adequately.  Romberg  negative.  IMPRESSION: This is a very pleasant 54 yo RH woman with a history of hypertension, with new onset seizure last 04/04/14. She started having word-finding difficulties followed by a convulsion. She recalls some right-sided weakness after. She had a second seizure on 09/12/14 again with speech arrest and right arm extension. Seizure concerning for partial seizure arising from the left hemisphere. Her neurological exam and MRI brain are normal. Her routine and 24-hour EEG show occasional focal slowing over the bilateral temporal regions, left greater than right, no epileptiform discharges. She is now on Lamictal XR 100mg /day. She reports two instances where she has no memory of events a few minutes before, no witnessed confusion. Unclear if due to medication side effect versus seizure, no confusion noted. A Lamictal level will be ordered. We will continue on Lamictal XR 100mg /day for now, but we discussed that dose may potentially be increased depending on her clinical symptoms. Continue to monitor memory issues. We discussed her several concerns, no evidence of rash today or other significant side effects on Lamictal. She is travelling in the next few months, we discussed sleep deprivation and medication schedule during travel. She is aware of [place] driving laws to stop driving until 6 months seizure-free. She will follow-up in 2 months and knows to call our office for any changes.   Thank you for allowing me to participate in her care.  Please do not hesitate to call for any questions or concerns.  The duration of this appointment visit was 15 minutes of face-to-face time with the patient.  Greater than 50% of this time was spent in counseling, explanation of diagnosis, planning of further management, and coordination of care.   Patrcia Dolly, M.D.   CC: Dr. Faythe Dingwall

## 2014-11-19 NOTE — Patient Instructions (Signed)
1. Bloodwork for Lamictal level 2. Continue Lamictal XR 100mg : Take 1 tablet daily 3. Follow-up in 2 months, call our office for any problems  Seizure Precautions: 1. If medication has been prescribed for you to prevent seizures, take it exactly as directed.  Do not stop taking the medicine without talking to your doctor first, even if you have not had a seizure in a long time.   2. Avoid activities in which a seizure would cause danger to yourself or to others.  Don't operate dangerous machinery, swim alone, or climb in high or dangerous places, such as on ladders, roofs, or girders.  Do not drive unless your doctor says you may.  3. If you have any warning that you may have a seizure, lay down in a safe place where you can't hurt yourself.    4.  No driving for 6 months from last seizure, as per [facility].   Please refer to the following link on the [employer]ile[employer] of [employer]'s website for more information: http://www.epilepsyfoundation.org/answerplace/Social/driving/drivingu.cfm   5.  Maintain good sleep hygiene.  6.  Contact your doctor if you have any problems that may be related to the medicine you are taking.  7.  Call 911 and bring the patient back to the ED if:        A.  The seizure lasts longer than 5 minutes.       B.  The patient doesn't awaken shortly after the seizure  C.  The patient has new problems such as difficulty seeing, speaking or moving  D.  The patient was injured during the seizure  E.  The patient has a temperature over 102 F (39C)  F.  The patient vomited and now is having trouble breathing

## 2014-12-05 ENCOUNTER — Telehealth: Payer: Self-pay | Admitting: Neurology

## 2014-12-05 NOTE — Telephone Encounter (Signed)
Lamictal level 11/28/14 on Lamictal XR 100mg /day: 2.3 (ref 2.0 to 20). Continue current dose of Lamictal if no clinical symptoms.

## 2014-12-07 ENCOUNTER — Telehealth: Payer: Self-pay | Admitting: Family Medicine

## 2014-12-07 NOTE — Telephone Encounter (Signed)
Patient returned my call. Notified her of results & advisement. 

## 2014-12-07 NOTE — Telephone Encounter (Signed)
Lmovm to return my call. 

## 2014-12-07 NOTE — Telephone Encounter (Signed)
-----   Message from Van ClinesKaren M Aquino, MD sent at 12/05/2014 12:43 PM EDT ----- Regarding: Lamictal level Pls let her know her Lamictal level is inside the range, continue on current dose of 100mg /day for now, call if any change in symptoms. Thanks

## 2015-01-21 ENCOUNTER — Ambulatory Visit (INDEPENDENT_AMBULATORY_CARE_PROVIDER_SITE_OTHER): Payer: No Typology Code available for payment source | Admitting: Neurology

## 2015-01-21 ENCOUNTER — Encounter: Payer: Self-pay | Admitting: Neurology

## 2015-01-21 VITALS — BP 138/90 | HR 66 | Resp 16 | Ht 66.0 in | Wt 197.0 lb

## 2015-01-21 DIAGNOSIS — G40009 Localization-related (focal) (partial) idiopathic epilepsy and epileptic syndromes with seizures of localized onset, not intractable, without status epilepticus: Secondary | ICD-10-CM

## 2015-01-21 MED ORDER — LAMOTRIGINE ER 25 MG PO TB24: ORAL_TABLET | ORAL | Status: DC

## 2015-01-21 MED ORDER — LAMOTRIGINE ER 100 MG PO TB24: ORAL_TABLET | ORAL | Status: DC

## 2015-01-21 NOTE — Patient Instructions (Addendum)
1. Take total dose of Lamotrigine ER  daily 2. Follow-up in 6 months, call for any changes 3. Have a safe trip!  Seizure Precautions: 1. If medication has been prescribed for you to prevent seizures, take it exactly as directed.  Do not stop taking the medicine without talking to your doctor first, even if you have not had a seizure in a long time.   2. Avoid activities in which a seizure would cause danger to yourself or to others.  Don't operate dangerous machinery, swim alone, or climb in high or dangerous places, such as on ladders, roofs, or girders.  Do not drive unless your doctor says you may.  3. If you have any warning that you may have a seizure, lay down in a safe place where you can't hurt yourself.    4.  No driving for 6 months from last seizure, as per [facility].   Please refer to the following link on the [employer]ile[employer]'s website for more information: http://www.epilepsyfoundation.org/answerplace/Social/driving/drivingu.cfm   5.  Maintain good sleep hygiene. Avoid alcohol.  6.  Contact your doctor if you have any problems that may be related to the medicine you are taking.  7.  Call 911 and bring the patient back to the ED if:        A.  The seizure lasts longer than 5 minutes.       B.  The patient doesn't awaken shortly after the seizure  C.  The patient has new problems such as difficulty seeing, speaking or moving  D.  The patient was injured during the seizure  E.  The patient has a temperature over 102 F (39C)  F.  The patient vomited and now is having trouble breathing

## 2015-01-21 NOTE — Progress Notes (Signed)
NEUROLOGY FOLLOW UP OFFICE NOTE  Destiny Carter [id number]  HISTORY OF PRESENT ILLNESS: I had the pleasure of seeing Destiny Carter in follow-up in the neurology clinic on 01/21/2015.  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 She is again accompanied by her husband who supplements the history today. She has had 2 seizures, most recently on 09/12/14. Lamictal level 2.3 (therapeutic range 2.0 to 20) on 100mg /day dose. She reports that on the second week of August, fluorescent lights would bother her that she had to wear sunglasses. She would get dizzy if she reads a lot. She self-increased Lamictal XR dose to 125mg /day last 8/19, and reports that all of these symptoms stopped. She still feels her memory is not as sharp, but denies any more gaps in time. She finds that if she really concentrates, she can recover some of those memories. She otherwise feels good except for back pain. They will be going to [place] in October for 20 days. She denies any headaches, dizziness, diplopia, focal numbness/tingling/weakness, olfactory/gustatory hallucinations, speech difficulties, no falls.  Seizure History: This is a very pleasant 54 yo RH woman with a history of hypertension, in her usual state of health until 04/04/2014 when she had a witnessed convulsion. She was sitting in a car parked waiting for a friend, speaking to her friend in Spanish (not her native language) when she could not think of the word she wanted to say. She has no further recollection of events, she was told that her eyes went back, face contorted, then she started having a generalized tonic-clonic seizure, banging her right leg on the door. This lasted for 2 minutes then she slumped to the left side. Her friend tried to pull her up then she started having another convulsion. No tongue bite or incontinence. When EMS arrived, she apparently got out of the car, grabbed her bags  and walked into the ambulance, but has no recollection of this. She was told she was repeating herself in the ambulance and speaking in [place]. She recalls her right side was weaker after the seizure, but was unsure if this was due to swelling from hitting the door with her right side. Her whole body was sore and right ankle and left knee were swollen from hitting the car. She was brought to [facility] ER where CBC, CMP, urine drug screen were unremarkable. I personally reviewed head CT which was normal. She wanted to hold off on medication at that time.  She had a second seizure on 09/12/14. She states that she was feeling well, sitting in the passenger seat of a car, when she stopped talking mid-sentence and turned to her husband, stating "I am going to have a seizure." She felt she could not verbalize, and saw bright lights like she was passing through trees, then lost awareness. Her husband reports she became quiet, then moaned, her right arm went straight up, followed by a generalized convulsion lasting 5 minutes. She was drooling after and not making sense. No focal weakness noted, she walked out of the car. When she regained awareness, she had a terrible headache and felt very tired and sleepy. She started Lamictal XR.  Epilepsy Risk Factors: She had a normal birth and early development. There is no history of febrile convulsions, CNS infections such as meningitis/encephalitis, significant traumatic brain injury, neurosurgical procedures, or family history of seizures.  Diagnostic data: I personally reviewed MRI brain with and without contrast which was normal,  hippocampi symmetric. Her routine and 24-hour EEG showed occasional focal slowing over the bilateral temporal regions, left greater than right, no epileptiform discharges seen. Typical events not captured.  PAST MEDICAL HISTORY: Past Medical History  Diagnosis Date  . Hypertension   . Asthma     MEDICATIONS: Current Outpatient Prescriptions  on File Prior to Visit  Medication Sig Dispense Refill  . aspirin 81 MG tablet Take 81 mg by mouth daily.    . hydrochlorothiazide (HYDRODIURIL) 25 MG tablet Take 25 mg by mouth. Take 1 tablet daily    . levalbuterol (XOPENEX HFA) 45 MCG/ACT inhaler Inhale into the lungs. Every 4 hrs prn    . losartan (COZAAR) 100 MG tablet Take 100 mg by mouth. Take 1 tablet daily    . Cholecalciferol (VITAMIN D3) 1000 UNITS CAPS Take by mouth daily.    . Collagen-Vitamin C (COLLAGEN PLUS VITAMIN C PO) Take by mouth daily.    Marland Kitchen EPIPEN 2-PAK 0.3 MG/0.3ML SOAJ injection PRN  3  Lamictal XR  +  daily No current facility-administered medications on file prior to visit.    ALLERGIES: Allergies  Allergen Reactions  . Other Anaphylaxis    Chili peppers, has epi pen  . Keflex [Cephalexin] Other (See Comments)  . Macrolides And Ketolides Itching and Rash  . Erythromycin Itching and Rash  . Latex Rash    FAMILY HISTORY: Family History  Problem Relation Age of Onset  . Cancer Father   . Cancer Mother     SOCIAL HISTORY: Social History   Social History  . Marital Status: Married    Spouse Name: N/A  . Number of Children: N/A  . Years of Education: N/A   Occupational History  . Not on file.   Social History Main Topics  . Smoking status: Never Smoker   . Smokeless tobacco: Not on file  . Alcohol Use: Yes     Comment: 3-4 Times weekly  . Drug Use: No  . Sexual Activity: Not on file   Other Topics Concern  . Not on file   Social History Narrative    REVIEW OF SYSTEMS: Constitutional: No fevers, chills, or sweats, no generalized fatigue, change in appetite Eyes: No visual changes, double vision, eye pain Ear, nose and throat: No hearing loss, ear pain, nasal congestion, sore throat Cardiovascular: No chest pain, palpitations Respiratory:  No shortness of breath at rest or with exertion, wheezes GastrointestinaI: No nausea, vomiting, diarrhea, abdominal pain, fecal  incontinence Genitourinary:  No dysuria, urinary retention or frequency Musculoskeletal:  No neck pain, back pain Integumentary: No rash, pruritus, skin lesions Neurological: as above Psychiatric: No depression, insomnia, anxiety Endocrine: No palpitations, fatigue, diaphoresis, mood swings, change in appetite, change in weight, increased thirst Hematologic/Lymphatic:  No anemia, purpura, petechiae. Allergic/Immunologic: no itchy/runny eyes, nasal congestion, recent allergic reactions, rashes  PHYSICAL EXAM: Filed Vitals:   01/21/15 1448  BP: 138/90  Pulse: 66  Resp: 16   General: No acute distress Head:  Normocephalic/atraumatic Neck: supple, no paraspinal tenderness, full range of motion Heart:  Regular rate and rhythm Lungs:  Clear to auscultation bilaterally Back: No paraspinal tenderness Skin/Extremities: No rash, no edema Neurological Exam: alert and oriented to person, place, and time. No aphasia or dysarthria. Fund of knowledge is appropriate.  Recent and remote memory are intact.  Attention and concentration are normal.    Able to name objects and repeat phrases. 3/3 delayed recall. Cranial nerves: Pupils equal, round, reactive to light.  Fundoscopic exam unremarkable, no  papilledema. Extraocular movements intact with no nystagmus. Visual fields full. Facial sensation intact. No facial asymmetry. Tongue, uvula, palate midline.  Motor: Bulk and tone normal, muscle strength 5/5 throughout with no pronator drift.  Sensation to light touch intact.  No extinction to double simultaneous stimulation.  Deep tendon reflexes 2+ throughout, toes downgoing.  Finger to nose testing intact.  Gait narrow-based and steady, able to tandem walk adequately.  Romberg negative.  IMPRESSION: This is a very pleasant 54 yo RH woman with a history of hypertension, with new onset seizure last 04/04/14. She started having word-finding difficulties followed by a convulsion. She recalls some right-sided  weakness after. She had a second seizure on 09/12/14 again with speech arrest and right arm extension. Seizure concerning for partial seizure arising from the left hemisphere. Her neurological exam and MRI brain are normal. Her routine and 24-hour EEG show occasional focal slowing over the bilateral temporal regions, left greater than right, no epileptiform discharges. She self-increased Lamictal XR to 125mg /day, reporting resolution of dizziness and light sensitivity. Continue on current dose. She is doing well otherwise, with no further seizures. She will follow-up with her PCP for the back pain. She is aware of [place] driving laws to stop driving until 6 months seizure-free. She will follow-up in 6 months and knows to call our office for any changes.   Thank you for allowing me to participate in her care.  Please do not hesitate to call for any questions or concerns.  The duration of this appointment visit was 24 minutes of face-to-face time with the patient.  Greater than 50% of this time was spent in counseling, explanation of diagnosis, planning of further management, and coordination of care.   Patrcia Dolly, M.D.   CC: Dr. Faythe Dingwall

## 2015-01-23 ENCOUNTER — Encounter: Payer: Self-pay | Admitting: Neurology

## 2015-07-16 ENCOUNTER — Ambulatory Visit (INDEPENDENT_AMBULATORY_CARE_PROVIDER_SITE_OTHER): Payer: No Typology Code available for payment source | Admitting: Neurology

## 2015-07-16 ENCOUNTER — Encounter: Payer: Self-pay | Admitting: Neurology

## 2015-07-16 VITALS — BP 128/94 | HR 96 | Ht 66.0 in | Wt 185.0 lb

## 2015-07-16 DIAGNOSIS — G40009 Localization-related (focal) (partial) idiopathic epilepsy and epileptic syndromes with seizures of localized onset, not intractable, without status epilepticus: Secondary | ICD-10-CM

## 2015-07-16 MED ORDER — LAMOTRIGINE ER 25 MG PO TB24: ORAL_TABLET | ORAL | Status: DC

## 2015-07-16 MED ORDER — LAMOTRIGINE ER 100 MG PO TB24: ORAL_TABLET | ORAL | Status: DC

## 2015-07-16 NOTE — Patient Instructions (Addendum)
1. Continue Lamotrigine ER  daily 2. Start daily multivitamin 3. Keep a calendar of your symptoms 4. Follow-up in 7 months, call for any changes  Seizure Precautions: 1. If medication has been prescribed for you to prevent seizures, take it exactly as directed.  Do not stop taking the medicine without talking to your doctor first, even if you have not had a seizure in a long time.   2. Avoid activities in which a seizure would cause danger to yourself or to others.  Don't operate dangerous machinery, swim alone, or climb in high or dangerous places, such as on ladders, roofs, or girders.  Do not drive unless your doctor says you may.  3. If you have any warning that you may have a seizure, lay down in a safe place where you can't hurt yourself.    4.  No driving for 6 months from last seizure, as per [facility].   Please refer to the following link on the [employer]ile[employer]'s website for more information: http://www.epilepsyfoundation.org/answerplace/Social/driving/drivingu.cfm   5.  Maintain good sleep hygiene. Avoid alcohol  6.  Contact your doctor if you have any problems that may be related to the medicine you are taking.  7.  Call 911 and bring the patient back to the ED if:        A.  The seizure lasts longer than 5 minutes.       B.  The patient doesn't awaken shortly after the seizure  C.  The patient has new problems such as difficulty seeing, speaking or moving  D.  The patient was injured during the seizure  E.  The patient has a temperature over 102 F (39C)  F.  The patient vomited and now is having trouble breathing

## 2015-07-16 NOTE — Progress Notes (Signed)
NEUROLOGY FOLLOW UP OFFICE NOTE  Destiny Carter [id number]  HISTORY OF PRESENT ILLNESS: I had the pleasure of seeing Destiny Carter in follow-up in the neurology clinic on 07/16/2015. 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 She is again accompanied by her husband who supplements the history today. She has had 2 seizures, last seizure on 09/12/14. Lamictal level 2.3 (therapeutic range 2.0 to 20) on /day dose. She self-increased dose to  with no side effects. She started to go on a vegan diet 5-6 weeks ago, and started noticing dizziness around 5-6 weeks ago. She initially felt her BP was low, but BP has been better but she continues to have a sense of lightheadedness and imbalance, such as when she turns her head or when she was stooping down one time and fell. She occasionally has pulsatile tinnitus in her right ear, no hearing loss or headaches. She denies any focal numbness/tingling/weakness, olfactory/gustatory hallucinations, speech difficulties, no falls. She and her husband continue to report occasional forgetfulness or replacing a word. She provides additional information that a day prior to the seizure, she had a sensation of ants crawling on the left side of her face. She had a few seconds of this sensation 2 months ago, otherwise no similar symptoms in the past 6 months.   Seizure History: This is a very pleasant 55 yo RH woman with a history of hypertension, in her usual state of health until 04/04/2014 when she had a witnessed convulsion. She was sitting in a car parked waiting for a friend, speaking to her friend in Spanish (not her native language) when she could not think of the word she wanted to say. She has no further recollection of events, she was told that her eyes went back, face contorted, then she started having a generalized tonic-clonic seizure, banging her right leg on the door. This lasted for 2  minutes then she slumped to the left side. Her friend tried to pull her up then she started having another convulsion. No tongue bite or incontinence. When EMS arrived, she apparently got out of the car, grabbed her bags and walked into the ambulance, but has no recollection of this. She was told she was repeating herself in the ambulance and speaking in [place]. She recalls her right side was weaker after the seizure, but was unsure if this was due to swelling from hitting the door with her right side. Her whole body was sore and right ankle and left knee were swollen from hitting the car. She was brought to [facility] ER where CBC, CMP, urine drug screen were unremarkable. I personally reviewed head CT which was normal. She wanted to hold off on medication at that time.  She had a second seizure on 09/12/14. She states that she was feeling well, sitting in the passenger seat of a car, when she stopped talking mid-sentence and turned to her husband, stating "I am going to have a seizure." She felt she could not verbalize, and saw bright lights like she was passing through trees, then lost awareness. Her husband reports she became quiet, then moaned, her right arm went straight up, followed by a generalized convulsion lasting 5 minutes. She was drooling after and not making sense. No focal weakness noted, she walked out of the car. When she regained awareness, she had a terrible headache and felt very tired and sleepy. She started Lamictal XR.  Epilepsy Risk Factors: She had a normal  birth and early development. There is no history of febrile convulsions, CNS infections such as meningitis/encephalitis, significant traumatic brain injury, neurosurgical procedures, or family history of seizures.  Diagnostic data: I personally reviewed MRI brain with and without contrast which was normal, hippocampi symmetric. Her routine and 24-hour EEG showed occasional focal slowing over the bilateral temporal regions, left greater  than right, no epileptiform discharges seen. Typical events not captured.  PAST MEDICAL HISTORY: Past Medical History  Diagnosis Date  . Hypertension   . Asthma     MEDICATIONS: Current Outpatient Prescriptions on File Prior to Visit  Medication Sig Dispense Refill  . aspirin 81 MG tablet Take 81 mg by mouth daily.    . hydrochlorothiazide (HYDRODIURIL) 25 MG tablet Take 25 mg by mouth. Take 1 tablet daily    . levalbuterol (XOPENEX HFA) 45 MCG/ACT inhaler Inhale into the lungs. Every 4 hrs prn    . losartan (COZAAR) 100 MG tablet Take 100 mg by mouth. Take 1 tablet daily    . Cholecalciferol (VITAMIN D3) 1000 UNITS CAPS Take by mouth daily.    . Collagen-Vitamin C (COLLAGEN PLUS VITAMIN C PO) Take by mouth daily.    Marland Kitchen EPIPEN 2-PAK 0.3 MG/0.3ML SOAJ injection PRN  3  Lamictal XR 100mg  + 25mg  daily No current facility-administered medications on file prior to visit.    ALLERGIES: Allergies  Allergen Reactions  . Other Anaphylaxis    Chili peppers, has epi pen  . Keflex [Cephalexin] Other (See Comments)  . Macrolides And Ketolides Itching and Rash  . Erythromycin Itching and Rash  . Latex Rash    FAMILY HISTORY: Family History  Problem Relation Age of Onset  . Cancer Father   . Cancer Mother     SOCIAL HISTORY: Social History   Social History  . Marital Status: Married    Spouse Name: N/A  . Number of Children: N/A  . Years of Education: N/A   Occupational History  . Not on file.   Social History Main Topics  . Smoking status: Never Smoker   . Smokeless tobacco: Not on file  . Alcohol Use: Yes     Comment: 3-4 Times weekly  . Drug Use: No  . Sexual Activity: Not on file   Other Topics Concern  . Not on file   Social History Narrative    REVIEW OF SYSTEMS: Constitutional: No fevers, chills, or sweats, no generalized fatigue, change in appetite Eyes: No visual changes, double vision, eye pain Ear, nose and throat: No hearing loss, ear pain, nasal  congestion, sore throat Cardiovascular: No chest pain, palpitations Respiratory:  No shortness of breath at rest or with exertion, wheezes GastrointestinaI: No nausea, vomiting, diarrhea, abdominal pain, fecal incontinence Genitourinary:  No dysuria, urinary retention or frequency Musculoskeletal:  No neck pain,+ back pain Integumentary: No rash, pruritus, skin lesions Neurological: as above Psychiatric: No depression, insomnia, anxiety Endocrine: No palpitations, fatigue, diaphoresis, mood swings, change in appetite, change in weight, increased thirst Hematologic/Lymphatic:  No anemia, purpura, petechiae. Allergic/Immunologic: no itchy/runny eyes, nasal congestion, recent allergic reactions, rashes  PHYSICAL EXAM: Filed Vitals:   07/16/15 0959  BP: 128/94  Pulse: 96   General: No acute distress Head:  Normocephalic/atraumatic Neck: supple, no paraspinal tenderness, full range of motion Heart:  Regular rate and rhythm Lungs:  Clear to auscultation bilaterally Back: No paraspinal tenderness Skin/Extremities: No rash, no edema Neurological Exam: alert and oriented to person, place, and time. No aphasia or dysarthria. Fund of knowledge is appropriate.  Recent and remote memory are intact.  Attention and concentration are normal.    Able to name objects and repeat phrases. Cranial nerves: Pupils equal, round, reactive to light.  Fundoscopic exam unremarkable, no papilledema. Extraocular movements intact with no nystagmus. Visual fields full. Facial sensation intact. No facial asymmetry. Tongue, uvula, palate midline.  Motor: Bulk and tone normal, muscle strength 5/5 throughout with no pronator drift.  Sensation to light touch intact.  No extinction to double simultaneous stimulation.  Deep tendon reflexes 2+ throughout, toes downgoing.  Finger to nose testing, heel to shin testing intact. No dysdiadochokinesia.  Gait narrow-based and steady, able to tandem walk adequately.  Romberg  negative.  IMPRESSION: This is a very pleasant 55 yo RH woman with a history of hypertension, with new onset seizure last 04/04/14. She started having word-finding difficulties followed by a convulsion. She recalls some right-sided weakness after. She had a second seizure on 09/12/14 again with speech arrest and right arm extension. Seizure concerning for partial seizure arising from the left hemisphere. Her neurological exam and MRI brain are normal. Her routine and 24-hour EEG show occasional focal slowing over the bilateral temporal regions, left greater than right, no epileptiform discharges. She continues on Lamictal XR to /day with no side effects. Continue current dose. She is understandably anxious about having another seizure, reporting an episode of "ants crawling" on the left side of her mouth one time in the past 6 months. Continue to monitor symptoms for now, we can potentially increase Lamictal in the future if symptoms increase in frequency, although unclear at this time if seizure-related or due to anxiety. She has started a vegan diet and since then has been having dizziness. Neurological exam normal, likely related to diet, she was advised to start vitamin/iron supplements. She is aware of [place] driving laws to stop driving until 6 months seizure-free. She will follow-up in 7 months and knows to call our office for any changes.   Thank you for allowing me to participate in her care.  Please do not hesitate to call for any questions or concerns.  The duration of this appointment visit was 25 minutes of face-to-face time with the patient.  Greater than 50% of this time was spent in counseling, explanation of diagnosis, planning of further management, and coordination of care.   Patrcia Dolly, M.D.   CC: Dr. Faythe Dingwall

## 2015-07-22 ENCOUNTER — Inpatient Hospital Stay
Admit: 2015-07-22 | Discharge: 2015-07-22 | Disposition: A | Discharge status: Home / Self Care | Payer: Charity | Attending: Emergency Medicine

## 2015-07-22 ENCOUNTER — Emergency Department: Admit: 2015-07-22 | Payer: Charity | Primary: Family Medicine

## 2015-07-22 DIAGNOSIS — S0990XA Unspecified injury of head, initial encounter: Secondary | ICD-10-CM

## 2015-07-22 MED ORDER — HYDROCODONE-ACETAMINOPHEN 5 MG-325 MG TAB
5-325 mg | ORAL_TABLET | ORAL | 0 refills | Status: AC | PRN
Start: 2015-07-22 — End: ?

## 2015-07-22 MED ORDER — AMOXICILLIN CLAVULANATE 875 MG-125 MG TAB
875-125 mg | ORAL_TABLET | Freq: Two times a day (BID) | ORAL | 0 refills | Status: AC
Start: 2015-07-22 — End: 2015-08-01

## 2015-07-22 MED ORDER — BACITRACIN 500 UNIT/G OINTMENT
500 unit/gram | CUTANEOUS | 0 refills | Status: AC
Start: 2015-07-22 — End: ?

## 2015-07-22 MED ORDER — HYDROCODONE-ACETAMINOPHEN 7.5 MG-325 MG TAB
ORAL | Status: DC
Start: 2015-07-22 — End: 2015-07-22
  Administered 2015-07-22: 21:00:00 via ORAL

## 2015-07-22 MED ORDER — BACITRACIN 500 UNIT/G TOPICAL PACKET
500 unit/gram | CUTANEOUS | Status: AC
Start: 2015-07-22 — End: 2015-07-22
  Administered 2015-07-22: 20:00:00 via TOPICAL

## 2015-07-22 MED ORDER — HYDROCODONE-ACETAMINOPHEN 5 MG-325 MG TAB
5-325 mg | ORAL | Status: AC
Start: 2015-07-22 — End: 2015-07-22
  Administered 2015-07-22: 21:00:00 via ORAL

## 2015-07-22 MED FILL — HYDROCODONE-ACETAMINOPHEN 7.5 MG-325 MG TAB: ORAL | Qty: 1

## 2015-07-22 MED FILL — BACITRACIN 500 UNIT/G TOPICAL PACKET: 500 unit/gram | CUTANEOUS | Qty: 1

## 2015-07-22 MED FILL — HYDROCODONE-ACETAMINOPHEN 5 MG-325 MG TAB: 5-325 mg | ORAL | Qty: 1

## 2015-07-22 NOTE — ED Provider Notes (Addendum)
HPI Comments: Carol Fleming is a  55 y.o. Normal build white female h/o HTN, Epilepsy, Asthma  presents to the ED via POV c/o headache, bruising/swelling abrasions to the face/forehead, neck pain that occurred just PTA. Pt reports tripped and face planted on the concrete. Denies LOC. Denies dizziness/LOC, double vision, vision change, light sensitivity, eye pain, nose bleed, dental pain, jaw pain, cp, sob, cough, abd pain, n/v, extremity pain but notes abrasions to the palm of the left hand. Denies any other complaints or concerns.     Last Td Unknown       Patient is a 55 y.o. female presenting with fall and skin laceration.   Fall   Associated symptoms include headaches and laceration. Pertinent negatives include no fever, no numbness, no abdominal pain, no nausea, no vomiting and no hematuria.   Laceration    Pertinent negatives include no numbness and no weakness.        Past Medical History:   Diagnosis Date   ??? Asthma    ??? Epilepsy (HCC)      Dr. Lesle Chris. Aquino    ??? Hypertension        Past Surgical History:   Procedure Laterality Date   ??? HX GYN           History reviewed. No pertinent family history.    Social History     Social History   ??? Marital status: MARRIED     Spouse name: N/A   ??? Number of children: N/A   ??? Years of education: N/A     Occupational History   ??? Not on file.     Social History Main Topics   ??? Smoking status: Never Smoker   ??? Smokeless tobacco: Not on file   ??? Alcohol use No   ??? Drug use: Not on file   ??? Sexual activity: Not on file     Other Topics Concern   ??? Not on file     Social History Narrative   ??? No narrative on file         ALLERGIES: Other food; Erythromycin; Keflex [cephalexin]; and Macrolide antibiotics    Review of Systems   Constitutional: Negative for chills and fever.   HENT: Positive for facial swelling. Negative for congestion, dental problem, drooling, ear pain, mouth sores, nosebleeds, rhinorrhea, sinus pressure, sore throat, trouble swallowing and voice change.     Eyes: Negative for photophobia, pain, discharge, redness, itching and visual disturbance.   Respiratory: Negative for cough and shortness of breath.    Cardiovascular: Negative for chest pain and palpitations.   Gastrointestinal: Negative for abdominal pain, diarrhea, nausea and vomiting.   Genitourinary: Negative for decreased urine volume, difficulty urinating, dysuria, flank pain, frequency, hematuria and urgency.   Musculoskeletal: Positive for neck pain. Negative for arthralgias, back pain, joint swelling and neck stiffness.   Skin: Positive for wound. Negative for color change, pallor and rash.   Neurological: Positive for headaches. Negative for dizziness, seizures, syncope, facial asymmetry, speech difficulty, weakness, light-headedness and numbness.   Psychiatric/Behavioral: Negative for behavioral problems. The patient is not nervous/anxious.        Vitals:    07/22/15 1213   BP: (!) 168/107   Pulse: 91   Resp: 18   Temp: 98.5 ??F (36.9 ??C)   SpO2: 99%   Weight: 82.1 kg (181 lb)   Height:  (1.676 m)            Physical Exam  Constitutional: She is oriented to person, place, and time. She appears well-developed and well-nourished. No distress.   HENT:   Head: Normocephalic. Head is with abrasion and with contusion. Head is without Battle's sign and without laceration.   Right Ear: Hearing, tympanic membrane, external ear and ear canal normal. No drainage. No mastoid tenderness. Tympanic membrane is not injected, not perforated, not erythematous and not bulging. No middle ear effusion. No hemotympanum.   Left Ear: Hearing, tympanic membrane, external ear and ear canal normal. No drainage. No mastoid tenderness. Tympanic membrane is not injected, not perforated, not erythematous and not bulging.  No middle ear effusion. No hemotympanum.   Nose: Sinus tenderness present. No mucosal edema, rhinorrhea, nose lacerations, nasal deformity, septal deviation or nasal septal hematoma.  No epistaxis. Right sinus exhibits maxillary sinus tenderness and frontal sinus tenderness. Left sinus exhibits maxillary sinus tenderness and frontal sinus tenderness.       Mouth/Throat: Uvula is midline, oropharynx is clear and moist and mucous membranes are normal. No trismus in the jaw. No uvula swelling or lacerations. No oropharyngeal exudate, posterior oropharyngeal edema, posterior oropharyngeal erythema or tonsillar abscesses.   Superficial abrasion/lac <5 mm to bridge of nose without significant swelling. No active epistaxis. Nares patent.    Eyes: Conjunctivae and EOM are normal. Pupils are equal, round, and reactive to light. Right eye exhibits no discharge. Left eye exhibits no discharge.   Neck: Trachea normal, normal range of motion, full passive range of motion without pain and phonation normal. Neck supple. No tracheal tenderness, no spinous process tenderness and no muscular tenderness present. No rigidity. No tracheal deviation, no edema, no erythema and normal range of motion present. No thyroid mass and no thyromegaly present.   Cardiovascular: Normal rate, regular rhythm and normal heart sounds.  Exam reveals no gallop and no friction rub.    No murmur heard.  Pulmonary/Chest: Effort normal and breath sounds normal. No accessory muscle usage or stridor. No tachypnea. No respiratory distress. She has no decreased breath sounds. She has no wheezes. She has no rhonchi. She has no rales.   Abdominal: Soft. Bowel sounds are normal. There is no tenderness. There is no rigidity, no CVA tenderness, no tenderness at McBurney's point and negative Murphy's sign.   Musculoskeletal:        Right shoulder: Normal.        Left shoulder: Normal.        Left wrist: Normal. She exhibits normal range of motion, no tenderness, no bony tenderness, no swelling, no effusion, no crepitus, no deformity and no laceration.        Right hip: Normal.        Left hip: Normal.        Right knee: Normal.         Left knee: Normal.        Cervical back: She exhibits tenderness, pain and spasm. She exhibits normal range of motion, no bony tenderness, no swelling, no edema, no deformity, no laceration and normal pulse.        Left forearm: Normal. She exhibits no tenderness, no bony tenderness, no swelling, no edema, no deformity and no laceration.        Right hand: Normal.        Left hand: She exhibits laceration (Superficial abrasions to the left hand. ). She exhibits normal range of motion, no tenderness, no bony tenderness, normal two-point discrimination, normal capillary refill, no deformity and no swelling. Normal sensation noted. Decreased sensation  is not present in the ulnar distribution, is not present in the medial redistribution and is not present in the radial distribution. Normal strength noted. She exhibits no thumb/finger opposition and no wrist extension trouble.        Right foot: Normal.        Left foot: Normal.   LUE Hand: Superficial abrasions. No significant swelling. No active bleeding. Cap refill < 2 seconds BUE. MS 5/5 BUE.     Cervical: No prevertebral swelling. + Paraspinal muscle TTP. Normal inspection.        Lymphadenopathy:     She has no cervical adenopathy.   Neurological: She is alert and oriented to person, place, and time. She has normal strength and normal reflexes. No sensory deficit.   Skin: Skin is warm and dry. Abrasion, bruising, ecchymosis and laceration noted. No rash noted. She is not diaphoretic. No cyanosis. No pallor.   See HENT, See [facility] exam.    Psychiatric: She has a normal mood and affect. Her behavior is normal.   Nursing note and vitals reviewed.       MDM  Number of Diagnoses or Management Options  Abnormal CT scan: new and requires workup  Cervical sprain, initial encounter: new and requires workup  CHI (closed head injury), initial encounter: new and requires workup  Essential hypertension: new and requires workup   Facial abrasion, initial encounter: new and requires workup  Facial hematoma, initial encounter: new and requires workup  Fall, initial encounter: new and requires workup  Hand abrasion, left, initial encounter: new and requires workup  Headache, unspecified headache type: new and requires workup  Diagnosis management comments: DDX: Migraine, Tension, Cluster, ICH, Cervical sprain, Cervical strain, Skull Fracture, CVA, TIA, Concussion with or without LOC, CHI, Skull Contusion, Neoplasm    DDX: Cervical Sprain/Strain, Fracture (Atlantoaxial Injury and Dysfunction), Brachial Plexus Injury, Cervical Disc Injury, Cervical Discogenic Pain Syndrome, Cervical Facet Syndrome, Cervical Radiculopathy, Myofascial Pain.     CT HEAD W/O CONTRAST RESULT:   1. No acute intracranial hemorrhage, mass effect, midline shift, or herniation.  No definite CT evidence of acute cortical infarct is seen. Please note that  noncontrast head CT may be normal in early acute infarct.   ??  2. Right frontal/supraorbital scalp hematoma. No underlying fracture is seen.  ??  3. Right sphenoid sinus hypodense air-fluid level, nonspecific perhaps acute  sinusitis. No hyperdensity seen to suggest layering hemorrhage related to  maxillofacial fracture. Please see separate CT face report for additional  Details.    CT MAX-FACE W/O CONTRAST RESULT:   1. Right frontal scalp and supraorbital soft tissue hematoma. No underlying  fracture is identified.  ??  2. Hypodense bubbly air-fluid level right sphenoid sinus, perhaps acute  sinusitis. No hyperdensity or fracture line to suggest finding relates to  hemorrhage from a maxillofacial fracture.    CT CERVICAL SPINE W/O CONTRAST RESULT:   1. No cervical fx spine is seen.  ??  2. Mild C5-C6 retrolisthesis, nonspecific but suspect due to prominent  degenerative changes present at this level.  .  3. Probable C4-5 fusion across the facet joints and perhaps across the disc   space, likely on a degenerative basis. No prior comparison study.    No repairable lac on exam.     Reviewed workup results, any meds, and discharge instructions OR admission plan with patient and any family present.  Answered all questions. Michaelyn Barter, PA  3:53 PM    PLEASE FOLLOW-UP AS DIRECTED  WITHOUT FAIL WITHIN THE TIME FRAME RECOMMENDED AS FAILURE TO DO SO COULD RESULT IN WORSENING OF YOUR PHYSICAL CONDITION, DEATH, AND OR PERMANENT DISABILITY.     RETURN TO THE EMERGENCY DEPARTMENT AT IF YOU ARE UNABLE TO FOLLOW-UP AS DIRECTED.     RETURN TO THE EMERGENCY DEPARTMENT AT ONCE IF YOU HAVE SYMPTOMS THAT DO NOT IMPROVE WITH TREATMENT, NEW SYMPTOMS, WORSENING SYMPTOMS, OR ANY OTHER CONCERNS.     THE PATIENT AGREES WITH THE DISCHARGE PLAN AND FOLLOW-UP INSTRUCTIONS. THE PATIENT AGREES TO REVIEW ALL HANDOUTS.            Amount and/or Complexity of Data Reviewed  Tests in the radiology section of CPT??: ordered and reviewed  Discussion of test results with the performing providers: yes  Decide to obtain previous medical records or to obtain history from someone other than the patient: yes  Obtain history from someone other than the patient: yes (Husband, Sister )  Review and summarize past medical records: yes  Independent visualization of images, tracings, or specimens: yes    Risk of Complications, Morbidity, and/or Mortality  Presenting problems: moderate  Diagnostic procedures: moderate  Management options: moderate    Patient Progress  Patient progress: stable      Procedures    Diagnosis:   1. Fall, initial encounter    2. CHI (closed head injury), initial encounter    3. Headache, unspecified headache type    4. Facial hematoma, initial encounter    5. Facial abrasion, initial encounter    6. Cervical sprain, initial encounter    7. Hand abrasion, left, initial encounter    8. Abnormal CT scan    9. Essential hypertension          Disposition: HOME    Follow-up Information      Follow up With Details Comments Contact Info    [facility] EMERGENCY DEPT  As needed, If symptoms worsen [address]  [place] [address]  [phone]    Dr. Despina Arias. [place] Call today Schedule appointment to be seen within 1 day without fail for further evaluation, review all imaging results and recheck blood pressure without fail! [address],   [place], [place] [address]  [phone]          Patient's Medications   Start Taking    AMOXICILLIN-CLAVULANATE (AUGMENTIN) 875-125 MG PER TABLET    Take 1 Tab by mouth two (2) times a day for 10 days.    BACITRACIN (BACITRACIN) 500 UNIT/GRAM OINT    Apply to affected area TID X 10 DAYS    HYDROCODONE-ACETAMINOPHEN (NORCO) 5-325 MG PER TABLET    Take 1 Tab by mouth every four (4) hours as needed (BREAKTHROUGH PAIN ONLY). Max Daily Amount: 6 Tabs.   Continue Taking    EPIPEN 2-PAK 0.3 MG/0.3 ML INJECTION    06/11/15 Dr. Agapito Games    HYDROCHLOROTHIAZIDE (HYDRODIURIL) 25 MG TABLET    Take 1 Tab by mouth daily. 07/05/15, QTY#90, Dr. Despina Arias. [place]    LAMOTRIGINE (LAMICTAL XR) 100 MG TR24 ER TABLET    Take 1 Tab by mouth daily. 07/05/15, QTY#90, Dr. Lesle Chris. Aqu[place]    LAMOTRIGINE (LAMICTAL XR) 25 MG TR24 ER TABLET    Take 1 Tab by mouth daily. 07/08/15, QTY#90,  Dr. Lesle Chris. [place]    LOSARTAN (COZAAR) 100 MG TABLET    Take 1 Tab by mouth daily. 07/05/15, QTY#90 Dr. Despina Arias. [place]   These Medications have changed  No medications on file   Stop Taking    No medications on file       No results found for this or any previous visit (from the past 24 hour(s)).

## 2015-07-22 NOTE — ED Triage Notes (Signed)
I went to my car, turn around and tripped over the curb. Hit head on the concrete. NO LOC. Abrasion right forehead, and upper lip,  and left hand. Laceration bride of the nose.

## 2015-07-22 NOTE — ED Notes (Signed)
I have reviewed discharge instructions and medications with the patient. The patient verbalized understanding.Patient is stable and in good condition with normal vital signs. Patient chose to discharge with their wristband; privacy risks were discussed. Patient escorted to lobby.

## 2015-07-26 ENCOUNTER — Telehealth: Payer: Self-pay | Admitting: Neurology

## 2015-07-26 NOTE — Telephone Encounter (Signed)
She states she tripped over her feet on the sidewalk and fell on her forehead this was Monday. She was evaluated in the ER with CT scan was told she had a concussion.   I did advise her to get plenty of rest(brain & physical). She will call back if she has any new neurological sxs.

## 2015-07-26 NOTE — Telephone Encounter (Signed)
Noted, thanks!

## 2015-07-26 NOTE — Telephone Encounter (Signed)
PT called to let Dr Karel JarvisAquino know that she had a head injury and concussion/Dawn CB# [phone]

## 2016-01-13 IMAGING — CT CT HEAD W/O CM
2 series · 16 of 30 positions shown, 18 images · non-contrast
Comparison: None.

CLINICAL DATA: Confusion.  Seizures.

EXAM:
CT HEAD WITHOUT CONTRAST
TECHNIQUE: Contiguous axial images were obtained from the base of the skull
through the vertex without intravenous contrast.

[Series 201: head w/o, idose (1) · axial · non-contrast · 0.41mm/px · z∈[+96,+216]mm · 8 of 32 slices shown, 10 images]
[im 4/32  brain]
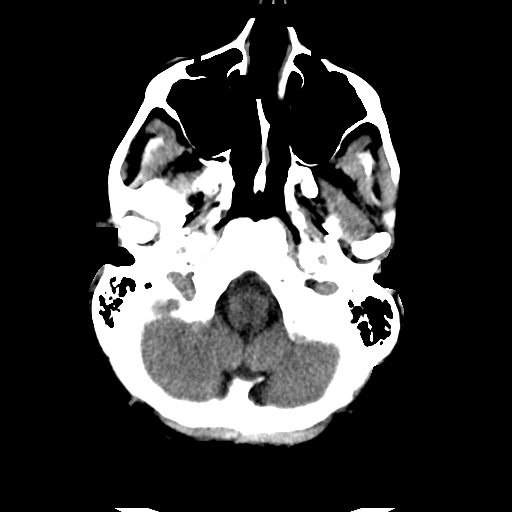
[im 4/32  bone]
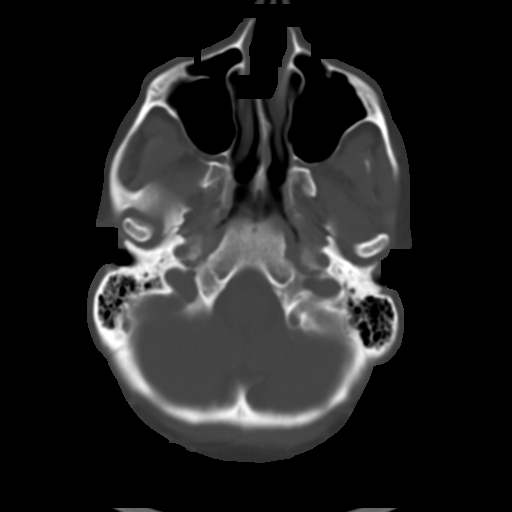
[im 7/32  brain]
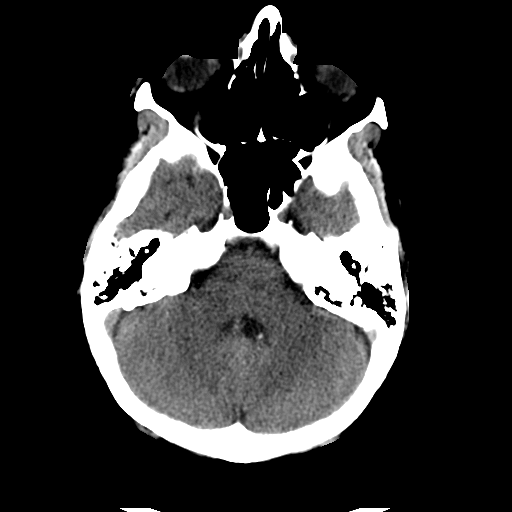
[im 11/32  brain]
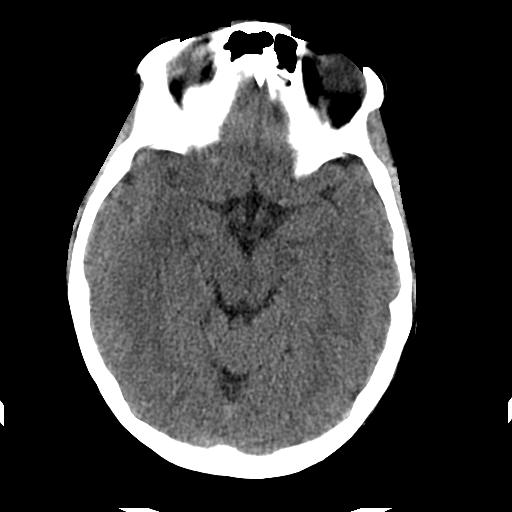
[im 14/32  brain]
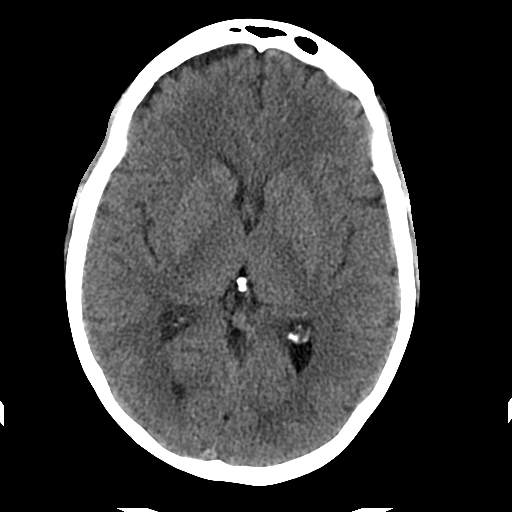
[im 18/32  brain]
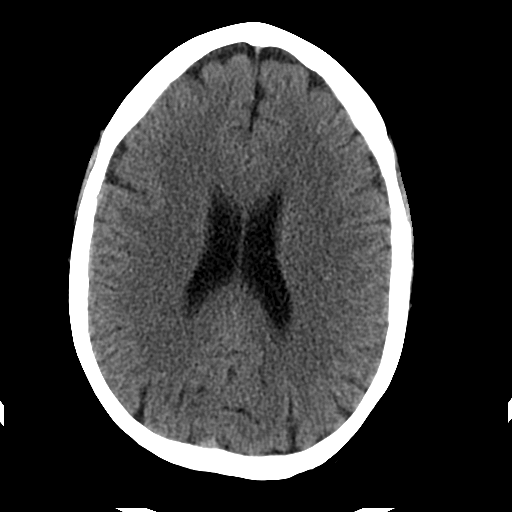
[im 18/32  bone]
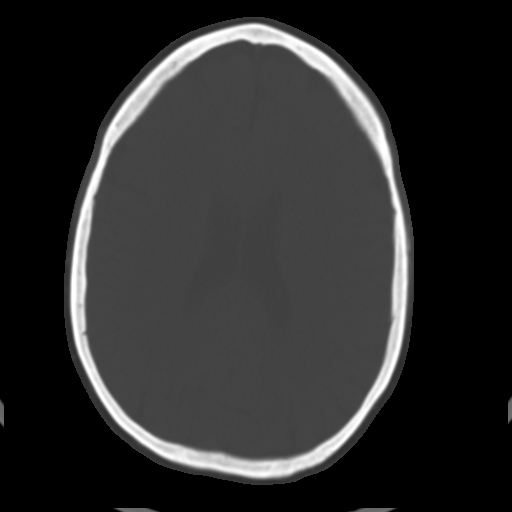
[im 21/32  brain]
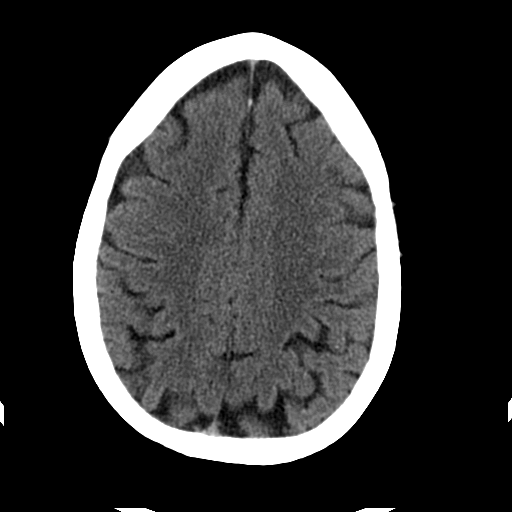
[im 25/32  brain]
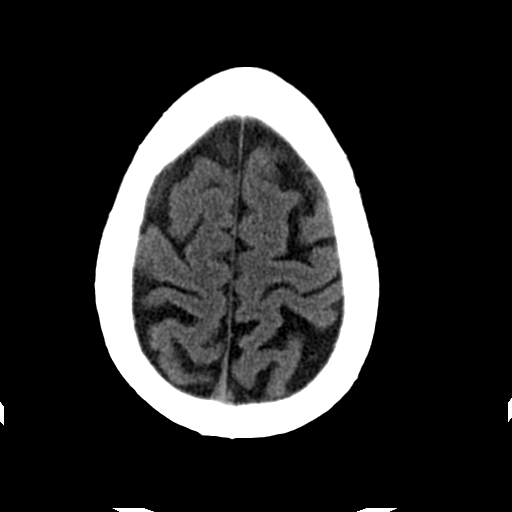
[im 28/32  brain]
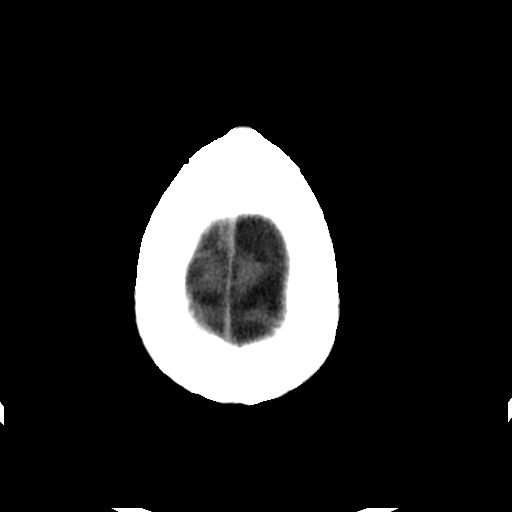

[Series 202: head w/o bone, idose (1) · axial · non-contrast · 0.41mm/px · z∈[+95,+220]mm · 8 of 64 slices shown]
[im 7/64  bone]
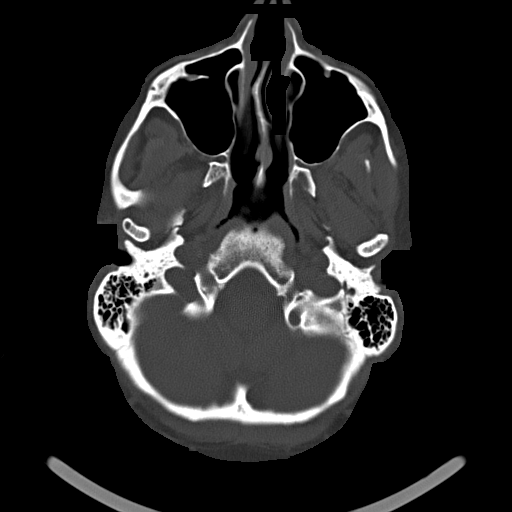
[im 14/64  bone]
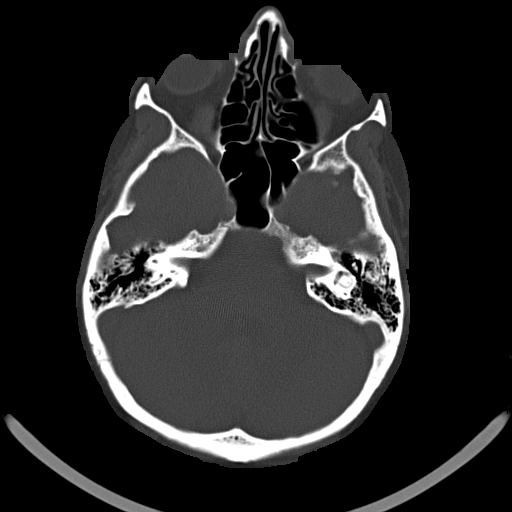
[im 20/64  bone]
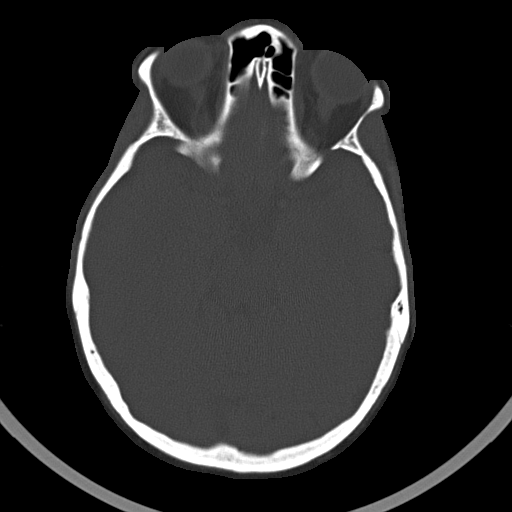
[im 27/64  bone]
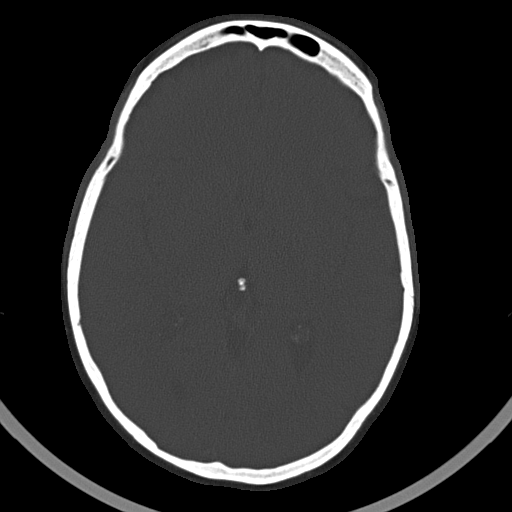
[im 37/64  bone]
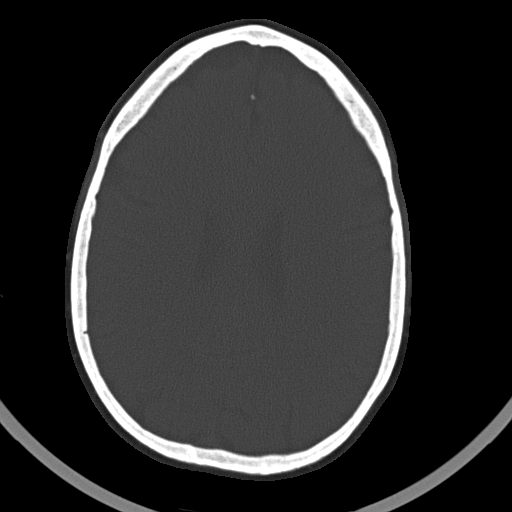
[im 44/64  bone]
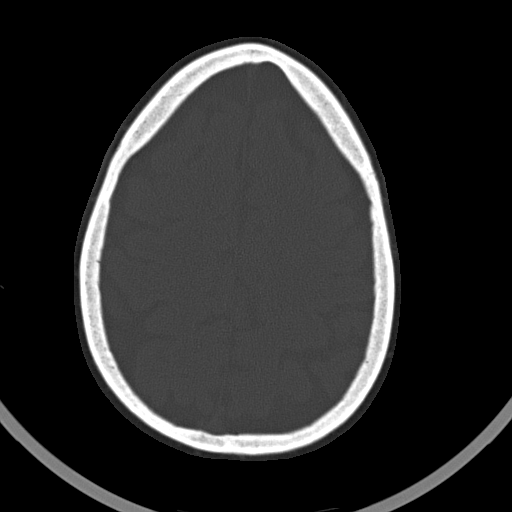
[im 50/64  bone]
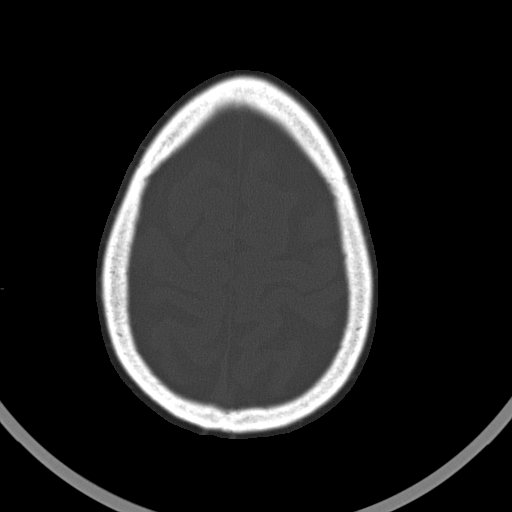
[im 57/64  bone]
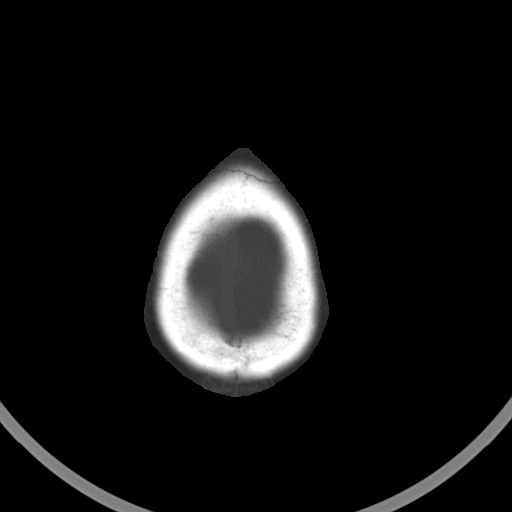

[16 of 30 positions shown; findings below may reference images not displayed]

FINDINGS: The brainstem, cerebellum, cerebral peduncles, thalamus, basal
ganglia, basilar cisterns, and ventricular system appear within
normal limits. No intracranial hemorrhage, mass lesion, or acute
CVA. Polypoid mucoperiosteal thickening of the right sphenoid sinus.
IMPRESSION: 1. Chronic right sphenoid sinusitis. Otherwise, no significant
abnormalities are observed.

## 2016-03-02 ENCOUNTER — Encounter: Payer: Self-pay | Admitting: Neurology

## 2016-03-02 ENCOUNTER — Ambulatory Visit (INDEPENDENT_AMBULATORY_CARE_PROVIDER_SITE_OTHER): Payer: No Typology Code available for payment source | Admitting: Neurology

## 2016-03-02 VITALS — BP 138/98 | HR 50 | Temp 98.0°F | Wt 182.1 lb

## 2016-03-02 DIAGNOSIS — G40009 Localization-related (focal) (partial) idiopathic epilepsy and epileptic syndromes with seizures of localized onset, not intractable, without status epilepticus: Secondary | ICD-10-CM

## 2016-03-02 DIAGNOSIS — R002 Palpitations: Secondary | ICD-10-CM

## 2016-03-02 MED ORDER — LAMOTRIGINE ER 100 MG PO TB24: ORAL_TABLET | ORAL | 3 refills | Status: DC

## 2016-03-02 MED ORDER — LAMOTRIGINE ER 25 MG PO TB24: ORAL_TABLET | ORAL | 3 refills | Status: DC

## 2016-03-02 NOTE — Progress Notes (Signed)
NEUROLOGY FOLLOW UP OFFICE NOTE  Destiny Carter [id number]  HISTORY OF PRESENT ILLNESS: I had the pleasure of seeing Destiny Carter in follow-up in the neurology clinic on 03/02/2016. 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 She is again accompanied by her husband who supplements the history today. She has had 2 seizures, last seizure on 09/12/14. She is taking Lamictal XR 125mg  (100mg +25mg  tablets) once a day without side effects. Since her last visit, she fell and hit her head at the end of February. She brings pictures showing bilateral periorbital hematomas and swelling. She had a head CT, as well as facial and cervical CTs which did not show any fracture or acute changes. She feels the bridge of her nose is crooked since then. She also reports that since the fall, she has been having bouts of palpitations lasting 2-5 minutes. They seem to be positional, worse when lying on her left side. There is no clear trigger, her heart starts racing when she least expects it. No associated shortness of breath. She reports having her first migraine a month after the head injury, with significant throbbing on the left temporal region, she was seeing bright flashing fireworks, light sensitive, with nausea lasting 3 days. She had some post-concussive symptoms, she could not comprehend or read well for a week after the fall. This has improved, she has been back to going to class without difficulty. No dizziness, focal numbness/tingling/weakness, olfactory/gustatory hallucinations, speech difficulties  Seizure History: This is a very pleasant 55 yo RH woman with a history of hypertension, in her usual state of health until 04/04/2014 when she had a witnessed convulsion. She was sitting in a car parked waiting for a friend, speaking to her friend in Spanish (not her native language) when she could not think of the word she wanted to say.  She has no further recollection of events, she was told that her eyes went back, face contorted, then she started having a generalized tonic-clonic seizure, banging her right leg on the door. This lasted for 2 minutes then she slumped to the left side. Her friend tried to pull her up then she started having another convulsion. No tongue bite or incontinence. When EMS arrived, she apparently got out of the car, grabbed her bags and walked into the ambulance, but has no recollection of this. She was told she was repeating herself in the ambulance and speaking in [place]. She recalls her right side was weaker after the seizure, but was unsure if this was due to swelling from hitting the door with her right side. Her whole body was sore and right ankle and left knee were swollen from hitting the car. She was [facility] to [facility] ER where CBC, CMP, urine drug screen were unremarkable. I personally reviewed head CT which was normal. She wanted to hold off on medication at that time.  She had a second seizure on 09/12/14. She states that she was feeling well, sitting in the passenger seat of a car, when she stopped talking mid-sentence and turned to her husband, stating "I am going to have a seizure." She felt she could not verbalize, and saw bright lights like she was passing through trees, then lost awareness. Her husband reports she became quiet, then moaned, her right arm went straight up, followed by a generalized convulsion lasting 5 minutes. She was drooling after and not making sense. No focal weakness noted, she walked out  of the car. When she regained awareness, she had a terrible headache and felt very tired and sleepy. She started Lamictal XR.  Epilepsy Risk Factors: She had a normal birth and early development. There is no history of febrile convulsions, CNS infections such as meningitis/encephalitis, significant traumatic brain injury, neurosurgical procedures, or family history of seizures.  Diagnostic  data: I personally reviewed MRI brain with and without contrast which was normal, hippocampi symmetric. Her routine and 24-hour EEG showed occasional focal slowing over the bilateral temporal regions, left greater than right, no epileptiform discharges seen. Typical events not captured.  PAST MEDICAL HISTORY: Past Medical History:  Diagnosis Date  . Asthma   . Hypertension     MEDICATIONS: Current Outpatient Prescriptions on File Prior to Visit  Medication Sig Dispense Refill  . aspirin 81 MG tablet Take 81 mg by mouth daily.    . hydrochlorothiazide (HYDRODIURIL) 25 MG tablet Take 25 mg by mouth. Take 1 tablet daily    . levalbuterol (XOPENEX HFA) 45 MCG/ACT inhaler Inhale into the lungs. Every 4 hrs prn    . losartan (COZAAR) 100 MG tablet Take 100 mg by mouth. Take 1 tablet daily    . Cholecalciferol (VITAMIN D3) 1000 UNITS CAPS Take by mouth daily.    . Collagen-Vitamin C (COLLAGEN PLUS VITAMIN C PO) Take by mouth daily.    Marland Kitchen EPIPEN 2-PAK 0.3 MG/0.3ML SOAJ injection PRN  3  Lamictal XR 100mg  + 25mg  daily No current facility-administered medications on file prior to visit.    ALLERGIES: Allergies  Allergen Reactions  . Other Anaphylaxis    Chili peppers, has epi pen  . Keflex [Cephalexin] Other (See Comments)  . Macrolides And Ketolides Itching and Rash  . Erythromycin Itching and Rash  . Latex Rash    FAMILY HISTORY: Family History  Problem Relation Age of Onset  . Cancer Father   . Cancer Mother     SOCIAL HISTORY: Social History   Social History  . Marital status: Married    Spouse name: N/A  . Number of children: N/A  . Years of education: N/A   Occupational History  . Not on file.   Social History Main Topics  . Smoking status: Never Smoker  . Smokeless tobacco: Not on file  . Alcohol use Yes     Comment: 3-4 Times weekly  . Drug use: No  . Sexual activity: Not on file   Other Topics Concern  . Not on file   Social History Narrative  . No  narrative on file    REVIEW OF SYSTEMS: Constitutional: No fevers, chills, or sweats, no generalized fatigue, change in appetite Eyes: No visual changes, double vision, eye pain Ear, nose and throat: No hearing loss, ear pain, nasal congestion, sore throat Cardiovascular: No chest pain, palpitations Respiratory:  No shortness of breath at rest or with exertion, wheezes GastrointestinaI: No nausea, vomiting, diarrhea, abdominal pain, fecal incontinence Genitourinary:  No dysuria, urinary retention or frequency Musculoskeletal:  No neck pain,+ back pain Integumentary: No rash, pruritus, skin lesions Neurological: as above Psychiatric: No depression, insomnia, anxiety Endocrine: No palpitations, fatigue, diaphoresis, mood swings, change in appetite, change in weight, increased thirst Hematologic/Lymphatic:  No anemia, purpura, petechiae. Allergic/Immunologic: no itchy/runny eyes, nasal congestion, recent allergic reactions, rashes  PHYSICAL EXAM: Vitals:   03/02/16 1136  BP: (!) 138/98  Pulse: (!) 50  Temp: 98 F (36.7 C)   General: No acute distress Head:  Normocephalic/atraumatic Neck: supple, no paraspinal tenderness, full range  of motion Heart:  Regular rate and rhythm Lungs:  Clear to auscultation bilaterally Back: No paraspinal tenderness Skin/Extremities: No rash, no edema Neurological Exam: alert and oriented to person, place, and time. No aphasia or dysarthria. Fund of knowledge is appropriate.  Recent and remote memory are intact. 3/3 delayed recall.  Attention and concentration are normal.    Able to name objects and repeat phrases. Cranial nerves: Pupils equal, round, reactive to light. Extraocular movements intact with no nystagmus. Visual fields full. Facial sensation intact. No facial asymmetry. Tongue, uvula, palate midline.  Motor: Bulk and tone normal, muscle strength 5/5 throughout with no pronator drift.  Sensation to light touch intact.  No extinction to double  simultaneous stimulation.  Deep tendon reflexes 2+ throughout, toes downgoing.  Finger to nose testing, heel to shin testing intact. No dysdiadochokinesia.  Gait narrow-based and steady, able to tandem walk adequately.  Romberg negative.  IMPRESSION: This is a very pleasant 55 yo RH woman with a history of hypertension, with new onset seizure last 04/04/14. She started having word-finding difficulties followed by a convulsion. She recalls some right-sided weakness after. She had a second seizure on 09/12/14 again with speech arrest and right arm extension. Seizure concerning for partial seizure arising from the left hemisphere. Her neurological exam and MRI brain are normal. Her routine and 24-hour EEG show occasional focal slowing over the bilateral temporal regions, left greater than right, no epileptiform discharges. She continues on Lamictal XR to 125mg /day with no side effects and no seizures since April 2016. She had a fall last February 2017 with periorbital hematomas and swelling, no intracranial abnormalities on scan. She has recovered from this, but reports palpitations 2-3 times a week since then. It is unclear how this is related to the fall, there may be a component of anxiety as well. She will be seeing Cardiology. She is aware of [place] driving laws to stop driving until 6 months seizure-free. She will follow-up in 6 months and knows to call our office for any changes.   Thank you for allowing me to participate in her care.  Please do not hesitate to call for any questions or concerns.  The duration of this appointment visit was 25 minutes of face-to-face time with the patient.  Greater than 50% of this time was spent in counseling, explanation of diagnosis, planning of further management, and coordination of care.   Patrcia Dolly, M.D.   CC: Dr. Faythe Dingwall

## 2016-03-02 NOTE — Patient Instructions (Signed)
1. Continue Lamictal XR 100mg  and 25mg  daily (total of 125mg  daily) 2. Proceed with Cardiology visit 3. Follow-up in 6 months, call for any changes  Seizure Precautions: 1. If medication has been prescribed for you to prevent seizures, take it exactly as directed.  Do not stop taking the medicine without talking to your doctor first, even if you have not had a seizure in a long time.   2. Avoid activities in which a seizure would cause danger to yourself or to others.  Don't operate dangerous machinery, swim alone, or climb in high or dangerous places, such as on ladders, roofs, or girders.  Do not drive unless your doctor says you may.  3. If you have any warning that you may have a seizure, lay down in a safe place where you can't hurt yourself.    4.  No driving for 6 months from last seizure, [place].   Please refer to the following link on the Epilepsy Foundation of America's website for more information: http://www.epilepsyfoundation.org/answerplace/Social/driving/drivingu.cfm   5.  Maintain good sleep hygiene. Avoid alcohol.  6.  Contact your doctor if you have any problems that may be related to the medicine you are taking.  7.  Call 911 and bring the patient back to the ED if:        A.  The seizure lasts longer than 5 minutes.       B.  The patient doesn't awaken shortly after the seizure  C.  The patient has new problems such as difficulty seeing, speaking or moving  D.  The patient was injured during the seizure  E.  The patient has a temperature over 102 F (39C)  F.  The patient vomited and now is having trouble breathing

## 2016-07-06 ENCOUNTER — Inpatient Hospital Stay: Admit: 2016-07-06 | Discharge: 2016-07-06 | Payer: Self-pay | Attending: Emergency Medicine

## 2016-09-07 ENCOUNTER — Ambulatory Visit: Payer: No Typology Code available for payment source | Admitting: Neurology

## 2016-09-14 ENCOUNTER — Ambulatory Visit: Payer: No Typology Code available for payment source | Admitting: Neurology

## 2016-09-21 ENCOUNTER — Ambulatory Visit (INDEPENDENT_AMBULATORY_CARE_PROVIDER_SITE_OTHER): Payer: No Typology Code available for payment source | Admitting: Neurology

## 2016-09-21 ENCOUNTER — Encounter: Payer: Self-pay | Admitting: Neurology

## 2016-09-21 DIAGNOSIS — G40009 Localization-related (focal) (partial) idiopathic epilepsy and epileptic syndromes with seizures of localized onset, not intractable, without status epilepticus: Secondary | ICD-10-CM | POA: Diagnosis not present

## 2016-09-21 MED ORDER — LAMOTRIGINE ER 100 MG PO TB24: ORAL_TABLET | ORAL | 3 refills | Status: DC

## 2016-09-21 MED ORDER — LAMOTRIGINE ER 25 MG PO TB24: ORAL_TABLET | ORAL | 3 refills | Status: DC
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
## 2016-09-21 NOTE — Patient Instructions (Signed)
1. Continue Lamotrigine ER  daily 2. Continue to monitor symptoms, call for any changes 3. Follow-up in 1 year, have a great year!  Seizure Precautions: 1. If medication has been prescribed for you to prevent seizures, take it exactly as directed.  Do not stop taking the medicine without talking to your doctor first, even if you have not had a seizure in a long time.   2. Avoid activities in which a seizure would cause danger to yourself or to others.  Don't operate dangerous machinery, swim alone, or climb in high or dangerous places, such as on ladders, roofs, or girders.  Do not drive unless your doctor says you may.  3. If you have any warning that you may have a seizure, lay down in a safe place where you can't hurt yourself.    4.  No driving for 6 months from last seizure, as per [facility].   Please refer to the following link on the Epilepsy Foundation of America's website for more information: http://www.epilepsyfoundation.org/answerplace/Social/driving/drivingu.cfm   5.  Maintain good sleep hygiene. Avoid alcohol  6.  Contact your doctor if you have any problems that may be related to the medicine you are taking.  7.  Call 911 and bring the patient back to the ED if:        A.  The seizure lasts longer than 5 minutes.       B.  The patient doesn't awaken shortly after the seizure  C.  The patient has new problems such as difficulty seeing, speaking or moving  D.  The patient was injured during the seizure  E.  The patient has a temperature over 102 F (39C)  F.  The patient vomited and now is having trouble breathing

## 2016-10-20 ENCOUNTER — Other Ambulatory Visit: Payer: Self-pay | Admitting: Neurology

## 2016-10-20 DIAGNOSIS — G40009 Localization-related (focal) (partial) idiopathic epilepsy and epileptic syndromes with seizures of localized onset, not intractable, without status epilepticus: Secondary | ICD-10-CM

## 2016-10-20 MED ORDER — LAMICTAL XR 25 MG PO TB24: ORAL_TABLET | ORAL | 3 refills | Status: DC

## 2016-10-20 MED ORDER — LAMICTAL XR 100 MG PO TB24: ORAL_TABLET | ORAL | 3 refills | Status: DC

## 2017-07-27 ENCOUNTER — Encounter: Payer: Self-pay | Admitting: Neurology

## 2017-07-27 ENCOUNTER — Other Ambulatory Visit: Payer: Self-pay

## 2017-07-27 ENCOUNTER — Ambulatory Visit (INDEPENDENT_AMBULATORY_CARE_PROVIDER_SITE_OTHER): Payer: No Typology Code available for payment source | Admitting: Neurology

## 2017-07-27 VITALS — BP 150/88 | HR 58 | Ht 66.0 in | Wt 171.0 lb

## 2017-07-27 DIAGNOSIS — G40009 Localization-related (focal) (partial) idiopathic epilepsy and epileptic syndromes with seizures of localized onset, not intractable, without status epilepticus: Secondary | ICD-10-CM | POA: Diagnosis not present

## 2017-07-27 MED ORDER — LAMICTAL XR 100 MG PO TB24: ORAL_TABLET | ORAL | 3 refills | Status: DC

## 2017-07-27 MED ORDER — LAMICTAL XR 25 MG PO TB24: ORAL_TABLET | ORAL | 3 refills | Status: DC

## 2017-07-27 NOTE — Progress Notes (Signed)
NEUROLOGY FOLLOW UP OFFICE NOTE  Destiny Carter [id number]  DOB: May 07, 1961  HISTORY OF PRESENT ILLNESS: I had the pleasure of seeing Destiny Carter in follow-up in the neurology clinic on 07/27/2017. 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 She is alone in the office today. She has had 2 convulsions, last convulsive seizure on 09/12/14. She is taking Lamictal XR 125mg  (100mg +25mg  tablets) once a day without side effects. Since her last visit, she reports a "blip" in September 2018 where she was reading cooking instructions out loud and as she got to the word "butter," she could not say it, she was thinking how to pronounce it in [place], and realized she was not well and called her husband. She sat with her eyes closed and relaxed, and the symptoms did not progress. She felt very tired with a terrible headache after. She recalls feeling extremely happy and excited at that time, similar to when she had her prior seizures. She does not recall being sleep deprived, no missed medications. She still notes some short-term memory issues. She has brief dizzy spells that she attributes to her BP. She denies any focal numbness/tingling/weakness, no staring/unresponsive episodes, olfactory/gustatory hallucinations, myoclonic jerks, confusional episodes.  Seizure History: This is a very pleasant 57 yo RH woman with a history of hypertension, in her usual state of health until 04/04/2014 when she had a witnessed convulsion. She was sitting in a car parked waiting for a friend, speaking to her friend in Spanish (not her native language) when she could not think of the word she wanted to say. She has no further recollection of events, she was told that her eyes went back, face contorted, then she started having a generalized tonic-clonic seizure, banging her right leg on the door. This lasted for 2 minutes then she slumped to the left side.  Her friend tried to pull her up then she started having another convulsion. No tongue bite or incontinence. When EMS arrived, she apparently got out of the car, grabbed her bags and walked into the ambulance, but has no recollection of this. She was told she was repeating herself in the ambulance and speaking in [place]. She recalls her right side was weaker after the seizure, but was unsure if this was due to swelling from hitting the door with her right side. Her whole body was sore and right ankle and left knee were swollen from hitting the car. She was brought to [facility] ER where CBC, CMP, urine drug screen were unremarkable. I personally reviewed head CT which was normal. She wanted to hold off on medication at that time.  She had a second seizure on 09/12/14. She states that she was feeling well, sitting in the passenger seat of a car, when she stopped talking mid-sentence and turned to her husband, stating "I am going to have a seizure." She felt she could not verbalize, and saw bright lights like she was passing through trees, then lost awareness. Her husband reports she became quiet, then moaned, her right arm went straight up, followed by a generalized convulsion lasting 5 minutes. She was drooling after and not making sense. No focal weakness noted, she walked out of the car. When she regained awareness, she had a terrible headache and felt very tired and sleepy. She started Lamictal XR.  Epilepsy Risk Factors: She had a normal birth and early development. There is no history of febrile convulsions, CNS infections  such as meningitis/encephalitis, significant traumatic brain injury, neurosurgical procedures, or family history of seizures.  Diagnostic data: I personally reviewed MRI brain with and without contrast which was normal, hippocampi symmetric. Her routine and 24-hour EEG showed occasional focal slowing over the bilateral temporal regions, left greater than right, no epileptiform discharges  seen. Typical events not captured.  PAST MEDICAL HISTORY: Past Medical History:  Diagnosis Date  . Asthma   . Hypertension     MEDICATIONS:  Outpatient Encounter Medications as of 07/27/2017  Medication Sig Note  . aspirin 81 MG tablet Take 81 mg by mouth daily.   . Cholecalciferol (VITAMIN D3) 1000 UNITS CAPS Take by mouth daily.   . Collagen-Vitamin C (COLLAGEN PLUS VITAMIN C PO) Take by mouth daily.   Marland Kitchen EPIPEN 2-PAK 0.3 MG/0.3ML SOAJ injection Reported on 07/16/2015 05/09/2014: Received from: External Pharmacy  . hydrochlorothiazide (HYDRODIURIL) 25 MG tablet Take 25 mg by mouth. Take 1 tablet daily 11/19/2014: Received from: [facility] Received Sig: Take by mouth.  Marland Kitchen LAMICTAL XR 100 MG TB24 Take 1 tablet daily (Take with Lamictal XR 25mg  tablet for total dose of 125mg  daily)   . LAMICTAL XR 25 MG TB24 tablet Take 1 tablet daily (Take with Lamictal XR 100mg  tablet for total dose of 125mg  daily)   . levalbuterol (XOPENEX HFA) 45 MCG/ACT inhaler Inhale into the lungs. Every 4 hrs prn 05/09/2014: Received from: [facility]  . losartan (COZAAR) 100 MG tablet Take 100 mg by mouth. Take 1 tablet daily 05/09/2014: Received from: [facility] Received Sig:   . Naproxen Sodium (ALEVE PO) Take by mouth.    No facility-administered encounter medications on file as of 07/27/2017.    ALLERGIES: Allergies  Allergen Reactions  . Other Anaphylaxis    Chili peppers, has epi pen  . Keflex [Cephalexin] Other (See Comments)  . Macrolides And Ketolides Itching and Rash  . Erythromycin Itching and Rash  . Latex Rash    FAMILY HISTORY: Family History  Problem Relation Age of Onset  . Cancer Father   . Cancer Mother     SOCIAL HISTORY: Social History   Socioeconomic History  . Marital status: Married    Spouse name: Not on file  . Number of children: Not on file  . Years of education: Not on file  . Highest education level: Not on file  Social Needs    . Financial resource strain: Not on file  . Food insecurity - worry: Not on file  . Food insecurity - inability: Not on file  . Transportation needs - medical: Not on file  . Transportation needs - non-medical: Not on file  Occupational History  . Not on file  Tobacco Use  . Smoking status: Never Smoker  Substance and Sexual Activity  . Alcohol use: Yes    Comment: 3-4 Times weekly  . Drug use: No  . Sexual activity: Not on file  Other Topics Concern  . Not on file  Social History Narrative  . Not on file    REVIEW OF SYSTEMS: Constitutional: No fevers, chills, or sweats, no generalized fatigue, change in appetite Eyes: No visual changes, double vision, eye pain Ear, nose and throat: No hearing loss, ear pain, nasal congestion, sore throat Cardiovascular: No chest pain, palpitations Respiratory:  No shortness of breath at rest or with exertion, wheezes GastrointestinaI: No nausea, vomiting, diarrhea, abdominal pain, fecal incontinence Genitourinary:  No dysuria, urinary retention or frequency Musculoskeletal:  No neck pain,+  back pain Integumentary: No rash, pruritus, skin lesions Neurological: as above Psychiatric: No depression, insomnia, anxiety Endocrine: No palpitations, fatigue, diaphoresis, mood swings, change in appetite, change in weight, increased thirst Hematologic/Lymphatic:  No anemia, purpura, petechiae. Allergic/Immunologic: no itchy/runny eyes, nasal congestion, recent allergic reactions, rashes  PHYSICAL EXAM: Vitals:   07/27/17 1342  BP: (!) 150/88  Pulse: (!) 58  SpO2: 98%   General: No acute distress Head:  Normocephalic/atraumatic Neck: supple, no paraspinal tenderness, full range of motion Heart:  Regular rate and rhythm Lungs:  Clear to auscultation bilaterally Back: No paraspinal tenderness Skin/Extremities: No rash, no edema Neurological Exam: alert and oriented to person, place, and time. No aphasia or dysarthria. Fund of knowledge is  appropriate.  Recent and remote memory are intact. 3/3 delayed recall. Attention and concentration are normal.    Able to name objects and repeat phrases, read a paragraph without difficulties. Cranial nerves: Pupils equal, round, reactive to light. Extraocular movements intact with no nystagmus. Visual fields full. Facial sensation intact. No facial asymmetry. Tongue, uvula, palate midline.  Motor: Bulk and tone normal, muscle strength 5/5 throughout with no pronator drift.  Sensation to light touch intact.  No extinction to double simultaneous stimulation.  Deep tendon reflexes 2+ throughout, toes downgoing.  Finger to nose testing intact. No dysdiadochokinesia.  Gait narrow-based and steady, able to tandem walk adequately.  Romberg negative.  IMPRESSION: This is a very pleasant 57 yo RH woman with a history of hypertension, with new onset seizure last 04/04/14. She started having word-finding difficulties followed by a convulsion. She recalls some right-sided weakness after. She had a second seizure on 09/12/14 again with speech arrest and right arm extension. Seizure concerning for partial seizure arising from the left hemisphere. Her neurological exam and MRI brain are normal. Her routine and 24-hour EEG show occasional focal slowing over the bilateral temporal regions, left greater than right, no epileptiform discharges. She had a transient episode of speech difficulty last September 2018. We discussed increasing dose of Lamictal XR, she agrees to increase to 150mg  daily. She is aware of [place] driving laws to stop driving until 6 months seizure-free. She will follow-up in 1 year and knows to call our office for any changes.   Thank you for allowing me to participate in her care.  Please do not hesitate to call for any questions or concerns.  The duration of this appointment visit was 25 minutes of face-to-face time with the patient.  Greater than 50% of this time was spent in counseling, explanation of  diagnosis, planning of further management, and coordination of care.   Destiny Carter, M.D.   CC: Dr. Faythe DingwallWoodbridge

## 2017-07-27 NOTE — Patient Instructions (Signed)
1. Increase to Lamictal XR to 150mg  daily: Take Lamictal XR 100mg  with Lamictal XR 25mg  2 tablets daily 2. Continue to keep a calendar of your symptoms 3. Follow-up in 1 year, call for any changes  Seizure Precautions: 1. If medication has been prescribed for you to prevent seizures, take it exactly as directed.  Do not stop taking the medicine without talking to your doctor first, even if you have not had a seizure in a long time.   2. Avoid activities in which a seizure would cause danger to yourself or to others.  Don't operate dangerous machinery, swim alone, or climb in high or dangerous places, such as on ladders, roofs, or girders.  Do not drive unless your doctor says you may.  3. If you have any warning that you may have a seizure, lay down in a safe place where you can't hurt yourself.    4.  No driving for 6 months from last seizure, [place].   Please refer to the following link on the Epilepsy Foundation of America's website for more information: http://www.epilepsyfoundation.org/answerplace/Social/driving/drivingu.cfm   5.  Maintain good sleep hygiene. Avoid alcohol.  6.  Contact your doctor if you have any problems that may be related to the medicine you are taking.  7.  Call 911 and bring the patient back to the ED if:        A.  The seizure lasts longer than 5 minutes.       B.  The patient doesn't awaken shortly after the seizure  C.  The patient has new problems such as difficulty seeing, speaking or moving  D.  The patient was injured during the seizure  E.  The patient has a temperature over 102 F (39C)  F.  The patient vomited and now is having trouble breathing

## 2017-09-20 ENCOUNTER — Ambulatory Visit: Payer: No Typology Code available for payment source | Admitting: Neurology

## 2017-10-11 ENCOUNTER — Encounter

## 2017-10-11 ENCOUNTER — Ambulatory Visit: Payer: No Typology Code available for payment source | Admitting: Neurology

## 2018-07-18 ENCOUNTER — Telehealth: Payer: Self-pay | Admitting: Neurology

## 2018-07-18 MED ORDER — LAMICTAL XR 100 MG PO TB24: ORAL_TABLET | ORAL | 1 refills | Status: DC

## 2018-07-18 MED ORDER — LAMICTAL XR 25 MG PO TB24: ORAL_TABLET | ORAL | 0 refills | Status: DC

## 2018-07-18 NOTE — Telephone Encounter (Signed)
Patient is calling in about the lamictal medication and wanting to pick up a prescription for this. She is coming through [place] within the next two hours and wants it ready for some program she is in. If she doesn't get the prescription sent in then she will have it cancelled. She takes 2 (25mg ) and 1 (100mg ) tablets. Please print so she can pick up.Thanks!

## 2018-07-18 NOTE — Telephone Encounter (Signed)
Lamictal 100mg  #30 with no refills Sig = take 1 tab daily with (2) 25mg  tabs.  Lamictal 25mg  #60 with no refills Sig = take 2 tabs daily with (1) 100mg  tab  Printed and placed on Dr. Rosalyn Gess desk for signature.  No refills given because pt has appointment scheduled for 3.2.2020 @ 3PM at which time new Rx will be written.

## 2018-07-25 ENCOUNTER — Ambulatory Visit (INDEPENDENT_AMBULATORY_CARE_PROVIDER_SITE_OTHER): Payer: No Typology Code available for payment source | Admitting: Neurology

## 2018-07-25 ENCOUNTER — Telehealth: Payer: Self-pay

## 2018-07-25 ENCOUNTER — Other Ambulatory Visit: Payer: Self-pay

## 2018-07-25 ENCOUNTER — Encounter: Payer: Self-pay | Admitting: Neurology

## 2018-07-25 VITALS — BP 112/78 | HR 67 | Ht 66.0 in | Wt 175.0 lb

## 2018-07-25 DIAGNOSIS — G40009 Localization-related (focal) (partial) idiopathic epilepsy and epileptic syndromes with seizures of localized onset, not intractable, without status epilepticus: Secondary | ICD-10-CM | POA: Diagnosis not present

## 2018-07-25 NOTE — Progress Notes (Signed)
NEUROLOGY FOLLOW UP OFFICE NOTE  Destiny Carter [id number]  DOB: 1960-06-23  HISTORY OF PRESENT ILLNESS: I had the pleasure of seeing Destiny Carter in follow-up in the neurology clinic on 07/25/2018. 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 She is again accompanied by her husband who helps supplement the history today. She has had 2 convulsions, last convulsive seizure on 09/12/14. There was an incident in September 2018 where she was reading instructions out loud and could not say a word, symptoms did not progress to a convulsion. Lamotrigine XR increased to  ( + 2  tablets) once a day without side effects. She denies any further similar spells since 2018. 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 She still notes some short-term memory issues, her husband notices she pauses to think sometimes. He denies any staring/unresponsive episodes. No gaps in time, olfactory/gustatory hallucinations, focal numbness/tingling/weakness, myoclonic jerks. She denies any headaches, dizziness, vision changes, no falls. She has started driving short distances without difficulties.  Seizure History: This is a very pleasant 58 yo RH woman with a history of hypertension, in her usual state of health until 04/04/2014 when she had a witnessed convulsion. She was sitting in a car parked waiting for a friend, speaking to her friend in Spanish (not her native language) when she could not think of the word she wanted to say. She has no further recollection of events, she was told that her eyes went back, face contorted, then she started having a generalized tonic-clonic seizure, banging her right leg on the  door. This lasted for 2 minutes then she slumped to the left side. Her friend tried to pull her up then she started having another convulsion. No tongue bite or incontinence. When EMS arrived, she apparently got out of the car, grabbed her bags and walked into the ambulance, but has no recollection of this. She was told she was repeating herself in the ambulance and speaking in [place]. She recalls her right side was weaker after the seizure, but was unsure if this was due to swelling from hitting the door with her right side. Her whole body was sore and right ankle and left knee were swollen from hitting the car. She was brought to [facility] ER where CBC, CMP, urine drug screen were unremarkable. I personally reviewed head CT which was normal. She wanted to hold off on medication at that time.  She had a second seizure on 09/12/14. She states that she was feeling well, sitting in the passenger seat of a car, when she stopped talking mid-sentence and turned to her husband, stating "I am going to have a seizure." She felt she could not verbalize, and saw bright lights like she was passing through trees, then lost awareness. Her husband reports she became quiet, then moaned, her right arm went straight up, followed by a generalized convulsion lasting 5 minutes. She was drooling after and not making sense. No focal weakness noted, she walked out of the car. When she regained awareness, she had a terrible headache and felt very tired and sleepy. She started Lamictal XR.  Epilepsy Risk Factors: She had a normal birth and early development. There is no history of febrile convulsions, CNS infections such  as meningitis/encephalitis, significant traumatic brain injury, neurosurgical procedures, or family history of seizures.  Diagnostic data: I personally reviewed MRI brain with and without contrast which was normal, hippocampi symmetric. Her routine and 24-hour EEG showed occasional focal slowing over the bilateral  temporal regions, left greater than right, no epileptiform discharges seen. Typical events not captured.  PAST MEDICAL HISTORY: Past Medical History:  Diagnosis Date  . Asthma   . Hypertension     MEDICATIONS:  Outpatient Encounter Medications as of 07/25/2018  Medication Sig Note  . aspirin 81 MG tablet Take 81 mg by mouth daily.   . Cholecalciferol (VITAMIN D3) 1000 UNITS CAPS Take by mouth daily.   . Collagen-Vitamin C (COLLAGEN PLUS VITAMIN C PO) Take by mouth daily.   Marland Kitchen EPIPEN 2-PAK 0.3 MG/0.3ML SOAJ injection Reported on 07/16/2015 05/09/2014: Received from: External Pharmacy  . hydrochlorothiazide (HYDRODIURIL) 25 MG tablet Take 25 mg by mouth daily.   Marland Kitchen LAMICTAL XR 100 MG TB24 24 hour tablet Take 1 tablet daily (Take with Lamictal XR 25mg  2 tablets for total dose of 150mg  daily)   . LAMICTAL XR 25 MG TB24 24 hour tablet Take 2 tablets daily (Take with Lamictal XR 100mg  tablet for total dose of 150mg  daily)   . levalbuterol (XOPENEX HFA) 45 MCG/ACT inhaler Inhale into the lungs. Every 4 hrs prn 05/09/2014: Received from: [facility]  . Naproxen Sodium (ALEVE PO) Take by mouth.   . losartan (COZAAR) 100 MG tablet Take 100 mg by mouth. Take 1 tablet daily 05/09/2014: Received from: [facility] Received Sig:    No facility-administered encounter medications on file as of 07/25/2018.    ALLERGIES: Allergies  Allergen Reactions  . Other Anaphylaxis    Chili peppers, has epi pen  . Keflex [Cephalexin] Other (See Comments)  . Macrolides And Ketolides Itching and Rash  . Erythromycin Itching and Rash  . Latex Rash    FAMILY HISTORY: Family History  Problem Relation Age of Onset  . Cancer Father   . Cancer Mother     SOCIAL HISTORY: Social History   Socioeconomic History  . Marital status: Married    Spouse name: Not on file  . Number of children: Not on file  . Years of education: Not on file  . Highest education level: Not on file  Occupational  History  . Not on file  Social Needs  . Financial resource strain: Not on file  . Food insecurity:    Worry: Not on file    Inability: Not on file  . Transportation needs:    Medical: Not on file    Non-medical: Not on file  Tobacco Use  . Smoking status: Never Smoker  . Smokeless tobacco: Never Used  Substance and Sexual Activity  . Alcohol use: Yes    Comment: 3-4 Times weekly  . Drug use: No  . Sexual activity: Not on file  Lifestyle  . Physical activity:    Days per week: Not on file    Minutes per session: Not on file  . Stress: Not on file  Relationships  . Social connections:    Talks on phone: Not on file    Gets together: Not on file    Attends religious service: Not on file    Active member of club or organization: Not on file    Attends meetings of clubs or organizations: Not on file    Relationship status: Not on file  . Intimate partner violence:  Fear of current or ex partner: Not on file    Emotionally abused: Not on file    Physically abused: Not on file    Forced sexual activity: Not on file  Other Topics Concern  . Not on file  Social History Narrative  . Not on file    REVIEW OF SYSTEMS: Constitutional: No fevers, chills, or sweats, no generalized fatigue, change in appetite Eyes: No visual changes, double vision, eye pain Ear, nose and throat: No hearing loss, ear pain, nasal congestion, sore throat Cardiovascular: No chest pain, palpitations Respiratory:  No shortness of breath at rest or with exertion, wheezes GastrointestinaI: No nausea, vomiting, diarrhea, abdominal pain, fecal incontinence Genitourinary:  No dysuria, urinary retention or frequency Musculoskeletal:  No neck pain, back pain Integumentary: No rash, pruritus, skin lesions Neurological: as above Psychiatric: No depression, insomnia, anxiety Endocrine: No palpitations, fatigue, diaphoresis, mood swings, change in appetite, change in weight, increased  thirst Hematologic/Lymphatic:  No anemia, purpura, petechiae. Allergic/Immunologic: no itchy/runny eyes, nasal congestion, recent allergic reactions, rashes  PHYSICAL EXAM: Vitals:   07/25/18 1506  BP: 112/78  Pulse: 67  SpO2: 99%   General: No acute distress Head:  Normocephalic/atraumatic Neck: supple, no paraspinal tenderness, full range of motion Heart:  Regular rate and rhythm Lungs:  Clear to auscultation bilaterally Back: No paraspinal tenderness Skin/Extremities: No rash, no edema Neurological Exam: alert and oriented to person, place, and time. No aphasia or dysarthria. Fund of knowledge is appropriate.  Recent and remote memory are intact. 3/3 delayed recall. Attention and concentration are normal.    Able to name objects and repeat phrases, read a paragraph without difficulties. Cranial nerves: Pupils equal, round, reactive to light. Extraocular movements intact with no nystagmus. Visual fields full. Facial sensation intact. No facial asymmetry. Tongue, uvula, palate midline.  Motor: Bulk and tone normal, muscle strength 5/5 throughout with no pronator drift.  Sensation to light touch intact.  No extinction to double simultaneous stimulation.  Deep tendon reflexes 2+ throughout, toes downgoing.  Finger to nose testing intact. Gait narrow-based and steady, able to tandem walk adequately.  Romberg negative.  IMPRESSION: This is a very pleasant 58 yo RH woman with a history of hypertension, with new onset seizure last 04/04/14. She started having word-finding difficulties followed by a convulsion. She recalls some right-sided weakness after. She had a second seizure on 09/12/14 again with speech arrest and right arm extension. Seizure concerning for partial seizure arising from the left hemisphere. Her neurological exam and MRI brain are normal. Her routine and 24-hour EEG show occasional focal slowing over the bilateral temporal regions, left greater than right, no epileptiform  discharges. No further transient speech difficulties since September 2018. Continue Lamictal XR 150mg  daily. She is aware of  driving laws to stop driving until 6 months seizure-free. She will follow-up in 1 year and knows to call our office for any changes.   Thank you for allowing me to participate in her care.  Please do not hesitate to call for any questions or concerns.  The duration of this appointment visit was 25 minutes of face-to-face time with the patient.  Greater than 50% of this time was spent in counseling, explanation of diagnosis, planning of further management, and coordination of care.   Patrcia Dolly, M.D.   CC: Dr. Faythe Dingwall

## 2018-07-25 NOTE — Patient Instructions (Signed)
Great seeing you! Continue on Lamictal XR 150mg  daily. Follow-up in 1 year, call for any changes.  Seizure Precautions: 1. If medication has been prescribed for you to prevent seizures, take it exactly as directed.  Do not stop taking the medicine without talking to your doctor first, even if you have not had a seizure in a long time.   2. Avoid activities in which a seizure would cause danger to yourself or to others.  Don't operate dangerous machinery, swim alone, or climb in high or dangerous places, such as on ladders, roofs, or girders.  Do not drive unless your doctor says you may.  3. If you have any warning that you may have a seizure, lay down in a safe place where you can't hurt yourself.    4.  No driving for 6 months from last seizure, as per [facility].   Please refer to the following link on the Epilepsy Foundation of America's website for more information: http://www.epilepsyfoundation.org/answerplace/Social/driving/drivingu.cfm   5.  Maintain good sleep hygiene. Avoid alcohol.  6.  Contact your doctor if you have any problems that may be related to the medicine you are taking.  7.  Call 911 and bring the patient back to the ED if:        A.  The seizure lasts longer than 5 minutes.       B.  The patient doesn't awaken shortly after the seizure  C.  The patient has new problems such as difficulty seeing, speaking or moving  D.  The patient was injured during the seizure  E.  The patient has a temperature over 102 F (39C)  F.  The patient vomited and now is having trouble breathing

## 2018-07-26 NOTE — Telephone Encounter (Signed)
Patient is calling about her Lamictal XR 100 and Lamictal XR 25 she needs to speak to someone about this. She states that she was to have a RX for a year supply and they dont have one please call

## 2018-07-27 ENCOUNTER — Ambulatory Visit: Payer: No Typology Code available for payment source | Admitting: Neurology

## 2018-07-27 NOTE — Telephone Encounter (Signed)
Spoke with Alvira Philips at [employer] [facility].  She states that Rx's (2) that was sent on 3.2.2020 was for #90 day supply with 3 refills.  Pt enrollment expires on 4.20.2020 and pt will be required to re-apply for the assistance program.  I asked that [facility] sent forms to pt soon, as I believe she may have her information confused.

## 2019-06-28 ENCOUNTER — Telehealth: Payer: Self-pay | Admitting: Neurology

## 2019-06-28 ENCOUNTER — Other Ambulatory Visit: Payer: Self-pay

## 2019-06-28 MED ORDER — LAMICTAL XR 100 MG PO TB24: ORAL_TABLET | ORAL | 1 refills | Status: DC

## 2019-06-28 MED ORDER — LAMICTAL XR 25 MG PO TB24: ORAL_TABLET | ORAL | 0 refills | Status: DC

## 2019-06-28 NOTE — Telephone Encounter (Signed)
Pt called voice mail left for her to call office back she needs to come fill out application for [employer] assistance

## 2019-06-28 NOTE — Telephone Encounter (Signed)
Patient called in regarding her Lamictal medication. She said the medication is very expensive. She uses a Program called [employer] Patient Assistance Program to help her with her medication. She said she is needing to re apply for the Application. The company will need faxed to them on the cover sheet her Name, D.O.B, the patient ID (Their Id #) which is [id number] to be able to verify the patient. Also, the fax # is [phone] and the phone # is [phone]. She said the medication is Lamictal XR 100 MG and Lamictal XR 25 MG. She would like this faxed please so she will be able to have the financial assistance. Thank you

## 2019-06-28 NOTE — Telephone Encounter (Signed)
Pt called voice mail left pt needs to fill out application for [employer] assistance to fax with script and fax sheet

## 2019-06-30 NOTE — Telephone Encounter (Signed)
Spoke with pt she is going to send her [employer] application via mychart for [place] to fax with script.

## 2019-06-30 NOTE — Telephone Encounter (Signed)
[employer] pt assistance paper work faxed to [phone]

## 2019-07-14 DIAGNOSIS — J45909 Unspecified asthma, uncomplicated: Secondary | ICD-10-CM | POA: Insufficient documentation

## 2019-07-14 DIAGNOSIS — G40909 Epilepsy, unspecified, not intractable, without status epilepticus: Secondary | ICD-10-CM | POA: Insufficient documentation

## 2019-07-17 ENCOUNTER — Encounter: Payer: Self-pay | Admitting: Neurology

## 2019-07-17 ENCOUNTER — Telehealth (INDEPENDENT_AMBULATORY_CARE_PROVIDER_SITE_OTHER): Payer: No Typology Code available for payment source | Admitting: Neurology

## 2019-07-17 ENCOUNTER — Other Ambulatory Visit: Payer: Self-pay

## 2019-07-17 VITALS — Ht 66.0 in | Wt 171.0 lb

## 2019-07-17 DIAGNOSIS — G40009 Localization-related (focal) (partial) idiopathic epilepsy and epileptic syndromes with seizures of localized onset, not intractable, without status epilepticus: Secondary | ICD-10-CM | POA: Diagnosis not present

## 2019-07-17 NOTE — Progress Notes (Signed)
Virtual Visit via Video Note The purpose of this virtual visit is to provide medical care while limiting exposure to the novel coronavirus.    Consent was obtained for video visit:  Yes.   Answered questions that patient had about telehealth interaction:  Yes.   I discussed the limitations, risks, security and privacy concerns of performing an evaluation and management service by telemedicine. I also discussed with the patient that there may be a patient responsible charge related to this service. The patient expressed understanding and agreed to proceed.  Pt location: Home Physician Location: office Name of referring provider:  Ara Kussmaul* I connected with Greta Doom at patients initiation/request on 07/17/2019 at  3:30 PM EST by video enabled telemedicine application and verified that I am speaking with the correct person using two identifiers. Pt MRN:  [id number] Pt DOB:  1960/09/14 Video Participants:  Greta Doom   History of Present Illness:  The patient was seen as a video visit on 07/17/2019. 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 She has been doing well seizure-free since 2018. Her last GTC was in 2016. She is on Lamotrigine XR 150mg  (taken as 100mg  + 2 25mg  tablets), no side effects. She reports doing very well over the past year. She has not had any problems with word interruption and her memory has been pretty good. She denies any staring/unresponsive episodes, gaps in time, olfactory/gustatory hallucinations, focal numbness/tingling/weakness, myoclonic jerks. No headaches, dizziness, no falls. Sleep is good. She is working remotely and keeping connected, exercising daily.   Seizure History: This is a very pleasant 59 yo RH woman with a history of hypertension, in her usual state of health until 04/04/2014 when she had a witnessed convulsion. She was sitting in a car parked waiting for a  friend, speaking to her friend in Spanish (not her native language) when she could not think of the word she wanted to say. She has no further recollection of events, she was told that her eyes went back, face contorted, then she started having a generalized tonic-clonic seizure, banging her right leg on the door. This lasted for 2 minutes then she slumped to the left side. Her friend tried to pull her up then she started having another convulsion. No tongue bite or incontinence. When EMS arrived, she apparently got out of the car, grabbed her bags and walked into the ambulance, but has no recollection of this. She was told she was repeating herself in the ambulance and speaking in . She recalls her right side was weaker after the seizure, but was unsure if this was due to swelling from hitting the door with her right side. Her whole body was sore and right ankle and left knee were swollen from hitting the car. She was brought to [facility] ER where CBC, CMP, urine drug screen were unremarkable. I personally reviewed head CT which was normal. She wanted to hold off on medication at that time.  She had a second seizure on 09/12/14. She states that she was feeling well, sitting in the passenger seat of a car, when she stopped talking mid-sentence and turned to her husband, stating "I am going to have a seizure." She felt she could not verbalize, and saw bright lights like she was passing through trees, then lost awareness. Her husband reports she became quiet, then moaned, her right arm went straight up, followed by a generalized convulsion lasting 5 minutes.  She was drooling after and not making sense. No focal weakness noted, she walked out of the car. When she regained awareness, she had a terrible headache and felt very tired and sleepy. She started Lamictal XR.  Epilepsy Risk Factors: She had a normal birth and early development. There is no history of febrile convulsions, CNS infections such as  meningitis/encephalitis, significant traumatic brain injury, neurosurgical procedures, or family history of seizures.  Diagnostic data: I personally reviewed MRI brain with and without contrast which was normal, hippocampi symmetric. Her routine and 24-hour EEG showed occasional focal slowing over the bilateral temporal regions, left greater than right, no epileptiform discharges seen. Typical events not captured.    Current Outpatient Medications on File Prior to Visit  Medication Sig Dispense Refill  . aspirin 81 MG tablet Take 81 mg by mouth daily.    . hydrochlorothiazide (HYDRODIURIL) 25 MG tablet Take 25 mg by mouth daily.    Marland Kitchen LAMICTAL XR 100 MG TB24 24 hour tablet Take 1 tablet daily (Take with Lamictal XR 25mg  2 tablets for total dose of 150mg  daily) 30 tablet 1  . LAMICTAL XR 25 MG TB24 24 hour tablet Take 2 tablets daily (Take with Lamictal XR 100mg  tablet for total dose of 150mg  daily) 60 tablet 0  . levalbuterol (XOPENEX HFA) 45 MCG/ACT inhaler Inhale into the lungs. Every 4 hrs prn    . telmisartan (MICARDIS) 80 MG tablet Take 80 mg by mouth daily.    Marland Kitchen EPIPEN 2-PAK 0.3 MG/0.3ML SOAJ injection Reported on 07/16/2015  3  . Naproxen Sodium (ALEVE PO) Take by mouth.     No current facility-administered medications on file prior to visit.     Observations/Objective:   Vitals:   07/17/19 1116  Weight: 171 lb (77.6 kg)  Height: 5\' 6"  (1.676 m)   GEN:  The patient appears stated age and is in NAD.  Neurological examination: Patient is awake, alert, oriented x 3. No aphasia or dysarthria. Intact fluency and comprehension. Remote and recent memory intact. Cranial nerves: Extraocular movements intact with no nystagmus. No facial asymmetry. Motor: moves all extremities symmetrically, at least anti-gravity x 4. No incoordination on finger to nose testing. Gait: narrow-based and steady, able to tandem walk adequately.  Assessment and Plan:   This is a very pleasant 59 yo RH woman with  a history of hypertension, with new onset seizure last 04/04/14. She started having word-finding difficulties followed by a convulsion. She recalls some right-sided weakness after. She had a second seizure on 09/12/14 again with speech arrest and right arm extension. Seizure concerning for partial seizure arising from the left hemisphere. Her neurological exam and MRI brain are normal. Her routine and 24-hour EEG show occasional focal slowing over the bilateral temporal regions, left greater than right, no epileptiform discharges. She has been event-free since 2018, no convulsions since 2016. No side effects on Lamictal XR 150mg  daily. She is aware of [place] driving laws to stop driving until 6 months seizure-free. She will follow-up in 1 year and knows to call our office for any changes.    Follow Up Instructions:   -I discussed the assessment and treatment plan with the patient. The patient was provided an opportunity to ask questions and all were answered. The patient agreed with the plan and demonstrated an understanding of the instructions.   The patient was advised to call back or seek an in-person evaluation if the symptoms worsen or if the condition fails to improve as anticipated.  Cameron Sprang, MD

## 2019-07-24 ENCOUNTER — Ambulatory Visit: Payer: No Typology Code available for payment source | Admitting: Neurology

## 2019-08-28 ENCOUNTER — Other Ambulatory Visit: Payer: Self-pay

## 2019-08-28 ENCOUNTER — Telehealth: Payer: Self-pay | Admitting: Neurology

## 2019-08-28 MED ORDER — LAMICTAL XR 100 MG PO TB24: ORAL_TABLET | ORAL | 4 refills | Status: DC

## 2019-08-28 MED ORDER — LAMICTAL XR 25 MG PO TB24: ORAL_TABLET | ORAL | 4 refills | Status: DC

## 2019-08-28 NOTE — Telephone Encounter (Signed)
Spoke to pt new script for Lamictal XR sent to [facility]

## 2019-08-28 NOTE — Telephone Encounter (Signed)
Patient gets her Lamictal prescriptions (2) through a patient assistance program with [employer]. She said the recent prescriptions were sent for one month only instead of in 90 day increments.   She needs both of the Lamictal XR prescriptions, 100 MG and 25 MG sent in again to [facility] in 90 day increments for 1 years worth of medication. She said [facility] needs a cover sheet from office faxed with this information.   [employer] [facility] Pillow  Fax: [phone]

## 2020-07-15 ENCOUNTER — Encounter: Payer: Self-pay | Admitting: Neurology

## 2020-07-15 ENCOUNTER — Telehealth (INDEPENDENT_AMBULATORY_CARE_PROVIDER_SITE_OTHER): Payer: No Typology Code available for payment source | Admitting: Neurology

## 2020-07-15 ENCOUNTER — Other Ambulatory Visit: Payer: Self-pay

## 2020-07-15 VITALS — BP 124/82 | Ht 66.0 in | Wt 176.0 lb

## 2020-07-15 DIAGNOSIS — G40009 Localization-related (focal) (partial) idiopathic epilepsy and epileptic syndromes with seizures of localized onset, not intractable, without status epilepticus: Secondary | ICD-10-CM

## 2020-07-15 MED ORDER — LAMICTAL XR 100 MG PO TB24: ORAL_TABLET | ORAL | 3 refills | Status: DC

## 2020-07-15 MED ORDER — LAMICTAL XR 50 MG PO TB24: ORAL_TABLET | ORAL | 3 refills | Status: DC

## 2020-07-15 NOTE — Progress Notes (Signed)
Virtual Visit via Video Note The purpose of this virtual visit is to provide medical care while limiting exposure to the novel coronavirus.    Consent was obtained for video visit:  Yes.   Answered questions that patient had about telehealth interaction:  Yes.   I discussed the limitations, risks, security and privacy concerns of performing an evaluation and management service by telemedicine. I also discussed with the patient that there may be a patient responsible charge related to this service. The patient expressed understanding and agreed to proceed.  Pt location: Home Physician Location: office Name of referring provider:  Ara Kussmaul* I connected with Destiny Carter at patients initiation/request on 07/15/2020 at  3:00 PM EST by video enabled telemedicine application and verified that I am speaking with the correct person using two identifiers. Pt MRN:  [id number] Pt DOB:  1960-12-09 Video Participants:  Destiny Carter   History of Present Illness:  The patient had a virtual video visit on 07/15/2020. 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 She has been seizure-free since 2018, her last GTC was in 2016. She is on Lamictal XR 150mg  (taking 100mg  and 2 25mg  tabs daily) without side effects. She denies any staring/unresponsive episodes, gaps in time, olfactory/gustatory hallucinations, speech changes, focal numbness/tingling/weakness, myoclonic jerks. She denies any headaches, dizziness, vision changes, no falls. Sleep is sometimes okay, she feels under a lot of pressure. Mood is pretty good. She started having reflux symptoms in November with a globus sensation in her throat, pain in her upper abdomen. This worsened her asthma, which had been dormant for 10 years. Last 2/10 she had a severe asthma attack where she could not clear her throat or breath for several seconds, they had to call 911, she found her  inhaler and after 3 puffs, she could breathe again.    Seizure History: This is a very pleasant 60 yo RH woman with a history of hypertension, in her usual state of health until 04/04/2014 when she had a witnessed convulsion. She was sitting in a car parked waiting for a friend, speaking to her friend in Spanish (not her native language) when she could not think of the word she wanted to say. She has no further recollection of events, she was told that her eyes went back, face contorted, then she started having a generalized tonic-clonic seizure, banging her right leg on the door. This lasted for 2 minutes then she slumped to the left side. Her friend tried to pull her up then she started having another convulsion. No tongue bite or incontinence. When EMS arrived, she apparently got out of the car, grabbed her bags and walked into the ambulance, but has no recollection of this. She was told she was repeating herself in the ambulance and speaking in 4/10. She recalls her right side was weaker after the seizure, but was unsure if this was due to swelling from hitting the door with her right side. Her whole body was sore and right ankle and left knee were swollen from hitting the car. She was brought to [facility] ER where CBC, CMP, urine drug screen were unremarkable. I personally reviewed head CT which was normal. She wanted to hold off on medication at that time.  She had a second seizure on 09/12/14. She states that she was feeling well, sitting in the passenger seat of a car, when she stopped talking mid-sentence and turned to her husband,  stating "I am going to have a seizure." She felt she could not verbalize, and saw bright lights like she was passing through trees, then lost awareness. Her husband reports she became quiet, then moaned, her right arm went straight up, followed by a generalized convulsion lasting 5 minutes. She was drooling after and not making sense. No focal weakness noted, she walked out of  the car. When she regained awareness, she had a terrible headache and felt very tired and sleepy. She started Lamictal XR.  Epilepsy Risk Factors: She had a normal birth and early development. There is no history of febrile convulsions, CNS infections such as meningitis/encephalitis, significant traumatic brain injury, neurosurgical procedures, or family history of seizures.  Diagnostic data: I personally reviewed MRI brain with and without contrast which was normal, hippocampi symmetric. Her routine and 24-hour EEG showed occasional focal slowing over the bilateral temporal regions, left greater than right, no epileptiform discharges seen. Typical events not captured.    Current Outpatient Medications on File Prior to Visit  Medication Sig Dispense Refill  . EPIPEN 2-PAK 0.3 MG/0.3ML SOAJ injection Reported on 07/16/2015  3  . hydrochlorothiazide (HYDRODIURIL) 25 MG tablet Take 25 mg by mouth daily.    Marland Kitchen LAMICTAL XR 100 MG TB24 24 hour tablet Take 1 tablet daily (Take with Lamictal XR 25mg  2 tablets for total dose of 150mg  daily) 90 tablet 4  . LAMICTAL XR 25 MG TB24 24 hour tablet Take 2 tablets daily (Take with Lamictal XR 100mg  tablet for total dose of 150mg  daily) 180 tablet 4  . levalbuterol (XOPENEX HFA) 45 MCG/ACT inhaler Inhale into the lungs. Every 4 hrs prn    . Naproxen Sodium (ALEVE PO) Take by mouth.    . telmisartan (MICARDIS) 80 MG tablet Take 80 mg by mouth daily.     No current facility-administered medications on file prior to visit.     Observations/Objective:   Vitals:   07/15/20 1430  BP: 124/82  Weight: 176 lb (79.8 kg)  Height: 5\' 6"  (1.676 m)   GEN:  The patient appears stated age and is in NAD.  Neurological examination: Patient is awake, alert. No aphasia or dysarthria. Intact fluency and comprehension. Remote and recent memory intact. Able to name and repeat. Cranial nerves: Extraocular movements intact with no nystagmus. No facial asymmetry. Motor: moves  all extremities symmetrically, at least anti-gravity x 4. No incoordination on finger to nose testing. Gait: narrow-based and steady, able to tandem walk adequately.    Assessment and Plan:   This is a very pleasant 60 yo RH woman with a history of hypertension, with new onset seizure last 04/04/14. She started having word-finding difficulties followed by a convulsion. She recalls some right-sided weakness after. She had a second seizure on 09/12/14 again with speech arrest and right arm extension. Seizure concerning for partial seizure arising from the left hemisphere. Brain MRI normal. Her routine and 24-hour EEG show occasional focal slowing over the bilateral temporal regions, left greater than right, no epileptiform discharges. She has been event-free since 2018, no convulsions since 2016 on Lamictal XR 150mg  daily (taking 100mg  tab + 50mg  tab). She is aware of [place] driving laws to stop driving after a seizure until 6 months seizure-free. Follow-up in 1 year, she knows to call for any changes.    Follow Up Instructions:   -I discussed the assessment and treatment plan with the patient. The patient was provided an opportunity to ask questions and all were answered. The patient  agreed with the plan and demonstrated an understanding of the instructions.   The patient was advised to call back or seek an in-person evaluation if the symptoms worsen or if the condition fails to improve as anticipated.    Cameron Sprang, MD

## 2020-07-15 NOTE — Patient Instructions (Signed)
Good to see you!  Your prescription for Lamictal XR will be changed to 50mg  tablet + 100mg  tablet daily, for total of 150mg  daily. Please drop off any forms we need to fill out for your medication.  Follow-up in 1 year, call for any changes.   Seizure Precautions: 1. If medication has been prescribed for you to prevent seizures, take it exactly as directed.  Do not stop taking the medicine without talking to your doctor first, even if you have not had a seizure in a long time.   2. Avoid activities in which a seizure would cause danger to yourself or to others.  Don't operate dangerous machinery, swim alone, or climb in high or dangerous places, such as on ladders, roofs, or girders.  Do not drive unless your doctor says you may.  3. If you have any warning that you may have a seizure, lay down in a safe place where you can't hurt yourself.    4.  No driving for 6 months from last seizure, as per [facility].   Please refer to the following link on the Epilepsy Foundation of America's website for more information: http://www.epilepsyfoundation.org/answerplace/Social/driving/drivingu.cfm   5.  Maintain good sleep hygiene. Avoid alcohol.  6.  Contact your doctor if you have any problems that may be related to the medicine you are taking.  7.  Call 911 and bring the patient back to the ED if:        A.  The seizure lasts longer than 5 minutes.       B.  The patient doesn't awaken shortly after the seizure  C.  The patient has new problems such as difficulty seeing, speaking or moving  D.  The patient was injured during the seizure  E.  The patient has a temperature over 102 F (39C)  F.  The patient vomited and now is having trouble breathing

## 2020-07-16 ENCOUNTER — Encounter: Payer: Self-pay | Admitting: Neurology

## 2020-08-15 ENCOUNTER — Other Ambulatory Visit: Payer: Self-pay

## 2020-08-16 MED ORDER — LAMICTAL XR 50 MG PO TB24: ORAL_TABLET | ORAL | 3 refills | Status: DC

## 2020-08-16 MED ORDER — LAMICTAL XR 100 MG PO TB24: ORAL_TABLET | ORAL | 3 refills | Status: DC

## 2021-05-07 ENCOUNTER — Other Ambulatory Visit: Payer: Self-pay

## 2021-05-07 ENCOUNTER — Ambulatory Visit (INDEPENDENT_AMBULATORY_CARE_PROVIDER_SITE_OTHER): Payer: No Typology Code available for payment source | Admitting: Neurology

## 2021-05-07 ENCOUNTER — Encounter: Payer: Self-pay | Admitting: Neurology

## 2021-05-07 VITALS — BP 151/90 | HR 68 | Ht 66.0 in | Wt 178.6 lb

## 2021-05-07 DIAGNOSIS — G40009 Localization-related (focal) (partial) idiopathic epilepsy and epileptic syndromes with seizures of localized onset, not intractable, without status epilepticus: Secondary | ICD-10-CM | POA: Diagnosis not present

## 2021-05-07 DIAGNOSIS — Z0279 Encounter for issue of other medical certificate: Secondary | ICD-10-CM

## 2021-05-07 MED ORDER — LAMICTAL XR 50 MG PO TB24: ORAL_TABLET | ORAL | 3 refills | Status: DC

## 2021-05-07 MED ORDER — LAMICTAL XR 100 MG PO TB24: ORAL_TABLET | ORAL | 3 refills | Status: DC

## 2021-05-07 NOTE — Progress Notes (Signed)
NEUROLOGY FOLLOW UP OFFICE NOTE  Shavaun Osterloh [id number] 01-15-1961  HISTORY OF PRESENT ILLNESS: I had the pleasure of seeing Cambreigh Dearing in follow-up in the neurology clinic on 05/07/2021.  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 She is alone in the office today. Since her last visit, she continues to do well with no convulsions since 2016. She had a transient episode of speech difficulties in 2018 and Lamictal XR was increased to 150mg  daily. She has been doing well with no further issues since then. She denies any staring/unresponsive episodes, gaps in time, olfactory/gustatory hallucinations, speech difficulties, focal numbness/tingling/weakness, myoclonic jerks. She has dizziness when looking down then up, no falls. She denies any headaches, vision changes, no falls. There has been a lot of stress being one of the primary caregivers for her mother, she had to move to .    Seizure History: This is a very pleasant 60 yo RH woman with a history of hypertension, in her usual state of health until 04/04/2014 when she had a witnessed convulsion. She was sitting in a car parked waiting for a friend, speaking to her friend in Spanish (not her native language) when she could not think of the word she wanted to say. She has no further recollection of events, she was told that her eyes went back, face contorted, then she started having a generalized tonic-clonic seizure, banging her right leg on the door. This lasted for 2 minutes then she slumped to the left side. Her friend tried to pull her up then she started having another convulsion. No tongue bite or incontinence. When EMS arrived, she apparently got out of the car, grabbed her bags and walked into the ambulance, but has no recollection of this. She was told she was repeating herself in the ambulance and speaking in 13/03/2014.  She recalls her right side was  weaker after the seizure, but was unsure if this was due to swelling from hitting the door with her right side. Her whole body was sore and right ankle and left knee were swollen from hitting the car. She was brought to [facility] ER where CBC, CMP, urine drug screen were unremarkable. I personally reviewed head CT which was normal. She wanted to hold off on medication at that time.  She had a second seizure on 09/12/14. She states that she was feeling well, sitting in the passenger seat of a car, when she stopped talking mid-sentence and turned to her husband, stating "I am going to have a seizure." She felt she could not verbalize, and saw bright lights like she was passing through trees, then lost awareness. Her husband reports she became quiet, then moaned, her right arm went straight up, followed by a generalized convulsion lasting 5 minutes. She was drooling after and not making sense. No focal weakness noted, she walked out of the car. When she regained awareness, she had a terrible headache and felt very tired and sleepy. She started Lamictal XR.  Epilepsy Risk Factors:  She had a normal birth and early development.  There is no history of febrile convulsions, CNS infections such as meningitis/encephalitis, significant traumatic brain injury, neurosurgical procedures, or family history of seizures.  Diagnostic data: I personally reviewed MRI brain with and without contrast which was normal, hippocampi symmetric. Her routine and 24-hour EEG showed occasional focal slowing over the bilateral temporal regions, left greater than right, no epileptiform discharges seen. Typical events  not captured.   PAST MEDICAL HISTORY: Past Medical History:  Diagnosis Date   Asthma    Hypertension     MEDICATIONS: Current Outpatient Medications on File Prior to Visit  Medication Sig Dispense Refill   EPIPEN 2-PAK 0.3 MG/0.3ML SOAJ injection Reported on 07/16/2015  3   hydrochlorothiazide (HYDRODIURIL) 25 MG tablet  Take 25 mg by mouth daily.     LAMICTAL XR 100 MG TB24 24 hour tablet Take 1 tablet daily (Take with Lamictal XR 50mg  tablet for total dose of 150mg  daily) 90 tablet 3   LAMICTAL XR 50 MG TB24 24 hour tablet Take 1 tablet daily (Take with Lamictal XR 100mg  tablet for total dose of 150mg  daily) 90 tablet 3   levalbuterol (XOPENEX HFA) 45 MCG/ACT inhaler Inhale into the lungs. Every 4 hrs prn     Naproxen Sodium (ALEVE PO) Take by mouth.     telmisartan (MICARDIS) 80 MG tablet Take 80 mg by mouth daily.     No current facility-administered medications on file prior to visit.    ALLERGIES: Allergies  Allergen Reactions   Other Anaphylaxis    Chili peppers, has epi pen   Keflex [Cephalexin] Other (See Comments)   Macrolides And Ketolides Itching and Rash   Erythromycin Itching and Rash   Latex Rash    FAMILY HISTORY: Family History  Problem Relation Age of Onset   Cancer Father    Cancer Mother     SOCIAL HISTORY: Social History   Socioeconomic History   Marital status: Married    Spouse name: Not on file   Number of children: Not on file   Years of education: Not on file   Highest education level: Not on file  Occupational History   Not on file  Tobacco Use   Smoking status: Never   Smokeless tobacco: Never  Substance and Sexual Activity   Alcohol use: Yes    Comment: 3-4 Times weekly   Drug use: No   Sexual activity: Not on file  Other Topics Concern   Not on file  Social History Narrative   Not on file   Social Determinants of Health   Financial Resource Strain: Not on file  Food Insecurity: Not on file  Transportation Needs: Not on file  Physical Activity: Not on file  Stress: Not on file  Social Connections: Not on file  Intimate Partner Violence: Not on file     PHYSICAL EXAM: Vitals:   05/07/21 1452 05/07/21 1605  BP: (!) 155/85 (!) 151/90  Pulse: 68   SpO2: 98%    General: No acute distress Head:  Normocephalic/atraumatic Skin/Extremities:  No rash, no edema Neurological Exam: alert and awake. No aphasia or dysarthria. Fund of knowledge is appropriate.  Recent and remote memory are intact.  Attention and concentration are normal.   Cranial nerves: Pupils equal, round. Extraocular movements intact with no nystagmus. Visual fields full.  No facial asymmetry.  Motor: Bulk and tone normal, muscle strength 5/5 throughout with no pronator drift.   Finger to nose testing intact.  Gait narrow-based and steady, able to tandem walk adequately.  Romberg negative.   IMPRESSION: This is a very pleasant 60 yo RH woman with a history of hypertension, with new onset seizure last 04/04/14. She started having word-finding difficulties followed by a convulsion. She recalls some right-sided weakness after. She had a second seizure on 09/12/14 again with speech arrest and right arm extension. Seizure concerning for partial seizure arising from the left hemisphere.  Brain MRI normal. Her routine and 24-hour EEG show occasional focal slowing over the bilateral temporal regions, left greater than right, no epileptiform discharges. She continues to do well with no convulsions since 2016, no speech difficulties since 2018. Continue Lamictal XR 150mg  daily (taking 100mg  tab + 50mg  tab). [place]. Follow-up in 1 year, call for any changes.   Thank you for allowing me to participate in her care.  Please do not hesitate to call for any questions or concerns.    , M.D.   CC: Dr. 

## 2021-05-07 NOTE — Patient Instructions (Signed)
Always good to see you. Continue Lamictal XR 150mg  daily. Follow-up in 1 year, call for any changes.   Seizure Precautions: 1. If medication has been prescribed for you to prevent seizures, take it exactly as directed.  Do not stop taking the medicine without talking to your doctor first, even if you have not had a seizure in a long time.   2. Avoid activities in which a seizure would cause danger to yourself or to others.  Don't operate dangerous machinery, swim alone, or climb in high or dangerous places, such as on ladders, roofs, or girders.  Do not drive unless your doctor says you may.  3. If you have any warning that you may have a seizure, lay down in a safe place where you can't hurt yourself.    4.  No driving for 6 months from last seizure, as per [facility].   Please refer to the following link on the Epile[employer] Foundation of America's website for more information: http://www.epilepsyfoundation.org/answerplace/Social/driving/drivingu.cfm   5.  Maintain good sleep hygiene. Avoid alcohol.  6.  Contact your doctor if you have any problems that may be related to the medicine you are taking.  7.  Call 911 and bring the patient back to the ED if:        A.  The seizure lasts longer than 5 minutes.       B.  The patient doesn't awaken shortly after the seizure  C.  The patient has new problems such as difficulty seeing, speaking or moving  D.  The patient was injured during the seizure  E.  The patient has a temperature over 102 F (39C)  F.  The patient vomited and now is having trouble breathing

## 2021-05-13 ENCOUNTER — Encounter: Payer: Self-pay | Admitting: Neurology

## 2021-05-20 ENCOUNTER — Telehealth: Payer: Self-pay | Admitting: Neurology

## 2021-05-20 NOTE — Telephone Encounter (Signed)
Called patient back to let her know we have faxed paperwork twice this morning and to let us know if they still have not received it

## 2021-05-20 NOTE — Telephone Encounter (Signed)
Patient called and said the [facility] has not received her forms yet that were completed.  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

## 2021-07-15 ENCOUNTER — Ambulatory Visit: Payer: No Typology Code available for payment source | Admitting: Neurology

## 2022-02-24 ENCOUNTER — Inpatient Hospital Stay: Admit: 2022-02-24

## 2022-05-04 ENCOUNTER — Ambulatory Visit (INDEPENDENT_AMBULATORY_CARE_PROVIDER_SITE_OTHER): Payer: No Typology Code available for payment source | Admitting: Neurology

## 2022-05-04 ENCOUNTER — Encounter: Payer: Self-pay | Admitting: Neurology

## 2022-05-04 VITALS — BP 164/93 | HR 93 | Ht 66.0 in | Wt 182.4 lb

## 2022-05-04 DIAGNOSIS — G40009 Localization-related (focal) (partial) idiopathic epilepsy and epileptic syndromes with seizures of localized onset, not intractable, without status epilepticus: Secondary | ICD-10-CM | POA: Diagnosis not present

## 2022-05-04 MED ORDER — LAMICTAL XR 100 MG PO TB24: ORAL_TABLET | ORAL | 3 refills | Status: DC

## 2022-05-04 MED ORDER — LAMICTAL XR 50 MG PO TB24: ORAL_TABLET | ORAL | 3 refills | Status: DC

## 2022-05-04 NOTE — Patient Instructions (Signed)
Good to see you. Continue Lamictal XR 150mg  daily. Continue to try to get as much sleep as possible, wishing you all the best. Call before you run out of a prescription in April and let May know where to send it. Follow-up in 1 year, call for any changes.   Seizure Precautions: 1. If medication has been prescribed for you to prevent seizures, take it exactly as directed.  Do not stop taking the medicine without talking to your doctor first, even if you have not had a seizure in a long time.   2. Avoid activities in which a seizure would cause danger to yourself or to others.  Don't operate dangerous machinery, swim alone, or climb in high or dangerous places, such as on ladders, roofs, or girders.  Do not drive unless your doctor says you may.  3. If you have any warning that you may have a seizure, lay down in a safe place where you can't hurt yourself.    4.  No driving for 6 months from last seizure, as per [facility].   Please refer to the following link on the Epilepsy Foundation of America's website for more information: http://www.epilepsyfoundation.org/answerplace/Social/driving/drivingu.cfm   5.  Maintain good sleep hygiene. Avoid alcohol.  6.  Contact your doctor if you have any problems that may be related to the medicine you are taking.  7.  Call 911 and bring the patient back to the ED if:        A.  The seizure lasts longer than 5 minutes.       B.  The patient doesn't awaken shortly after the seizure  C.  The patient has new problems such as difficulty seeing, speaking or moving  D.  The patient was injured during the seizure  E.  The patient has a temperature over 102 F (39C)  F.  The patient vomited and now is having trouble breathing

## 2022-05-04 NOTE — Progress Notes (Signed)
Formatting of this note is different from the original.  Images from the original note were not included.      NEUROLOGY FOLLOW UP OFFICE NOTE    Jaylan Duggar  [id number]  11-29-1960    HISTORY OF PRESENT ILLNESS:  I had the pleasure of seeing Carol Fleming in follow-up in the neurology clinic on 05/04/2022.  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 She is alone in the office today. Records and images were personally reviewed where available.  Since her last visit, she continues to do well with no convulsions since 2016, no episodes of speech difficulties since 2018. She is on Lamotrigine XR 150mg  daily without side effects. No staring/unresponsive episodes, gaps in time, olfactory/gustatory hallucinations, focal numbness/tingling/weakness, myoclonic jerks. 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 Every time her mother coughs, she wakes up. She only got 3 hours of sleep last night and has a headache currently. Otherwise she does not usually have headaches or dizziness. BP today 164/93, but when she checks at home, it is not terrible.     Seizure History: This is a very pleasant 61 yo RH woman with a history of hypertension, in her usual state of health until 04/04/2014 when she had a witnessed convulsion. She was sitting in a car parked waiting for a friend, speaking to her friend in Spanish (not her native language) when she could not think of the word she wanted to say. She has no further recollection of events, she was told that her eyes went back, face contorted, then she started having a generalized tonic-clonic seizure, banging her right leg on the door. This lasted for 2 minutes then she slumped to the left side. Her friend tried to pull her up then she started having another convulsion. No tongue bite or incontinence. When EMS arrived, she apparently got out of the car,  grabbed her bags and walked into the ambulance, but has no recollection of this. She was told she was repeating herself in the ambulance and speaking in 13/03/2014.  She recalls her right side was weaker after the seizure, but was unsure if this was due to swelling from hitting the door with her right side. Her whole body was sore and right ankle and left knee were swollen from hitting the car. She was brought to [facility] ER where CBC, CMP, urine drug screen were unremarkable. I personally reviewed head CT which was normal. She wanted to hold off on medication at that time.    She had a second seizure on 09/12/14. She states that she was feeling well, sitting in the passenger seat of a car, when she stopped talking mid-sentence and turned to her husband, stating "I am going to have a seizure." She felt she could not verbalize, and saw bright lights like she was passing through trees, then lost awareness. Her husband reports she became quiet, then moaned, her right arm went straight up, followed by a generalized convulsion lasting 5 minutes. She was drooling after and not making sense. No focal weakness noted, she walked out of the car. When she regained awareness, she had a terrible headache and felt very tired and sleepy. She started Lamictal XR.    Epilepsy Risk Factors:  She had a normal birth and early development.  There is no history of febrile convulsions, CNS infections such as meningitis/encephalitis, significant traumatic brain  injury, neurosurgical procedures, or family history of seizures.    Diagnostic data: I personally reviewed MRI brain with and without contrast which was normal, hippocampi symmetric. Her routine and 24-hour EEG showed occasional focal slowing over the bilateral temporal regions, left greater than right, no epileptiform discharges seen. Typical events not captured.    PAST MEDICAL HISTORY:  Past Medical History:   Diagnosis Date    Asthma     Hypertension      MEDICATIONS:  Current Outpatient  Medications on File Prior to Visit   Medication Sig Dispense Refill    EPIPEN 2-PAK 0.3 MG/0.3ML SOAJ injection Reported on 07/16/2015  3    hydrochlorothiazide (HYDRODIURIL) 25 MG tablet Take 25 mg by mouth daily.      LAMICTAL XR 100 MG 24 hour tablet Take 1 tablet daily (Take with Lamictal XR 50mg  tablet for total dose of 150mg  daily) 90 tablet 3    LAMICTAL XR 50 MG 24 hour tablet Take 1 tablet daily (Take with Lamictal XR 100mg  tablet for total dose of 150mg  daily) 90 tablet 3    levalbuterol (XOPENEX HFA) 45 MCG/ACT inhaler Inhale into the lungs. Every 4 hrs prn      telmisartan (MICARDIS) 80 MG tablet Take 80 mg by mouth daily.       No current facility-administered medications on file prior to visit.     ALLERGIES:  Allergies   Allergen Reactions    Other Anaphylaxis     Chili peppers, has epi pen    Keflex [Cephalexin] Other (See Comments)    Macrolides And Ketolides Itching and Rash    Erythromycin Itching and Rash    Latex Rash     FAMILY HISTORY:  Family History   Problem Relation Age of Onset    Cancer Father     Cancer Mother      SOCIAL HISTORY:  Social History     Socioeconomic History    Marital status: Married     Spouse name: Not on file    Number of children: Not on file    Years of education: Not on file    Highest education level: Not on file   Occupational History    Not on file   Tobacco Use    Smoking status: Never    Smokeless tobacco: Never   Vaping Use    Vaping Use: Never used   Substance and Sexual Activity    Alcohol use: Yes     Comment: 3-4 Times weekly    Drug use: No    Sexual activity: Not on file   Other Topics Concern    Not on file   Social History Narrative    Right handed      Social Determinants of Health     Financial Resource Strain: Not on file   Food Insecurity: Not on file   Transportation Needs: Not on file   Physical Activity: Not on file   Stress: Not on file   Social Connections: Not on file   Intimate Partner Violence: Not on file     PHYSICAL EXAM:  Vitals:     05/04/22 1457   BP: (!) 164/93   Pulse: 93   SpO2: 98%     General: No acute distress  Head:  Normocephalic/atraumatic  Skin/Extremities: No rash, no edema  Neurological Exam: alert and awake. No aphasia or dysarthria. Fund of knowledge is appropriate.  Attention and concentration are normal.   Cranial nerves: Pupils equal, round. Extraocular  movements intact with no nystagmus. Visual fields full.  No facial asymmetry.  Motor: Bulk and tone normal, muscle strength 5/5 throughout with no pronator drift.   Finger to nose testing intact.  Gait narrow-based and steady, able to tandem walk adequately.  Romberg negative.    IMPRESSION:  This is a very pleasant 61 yo RH woman with a history of hypertension, with new onset seizure last 04/04/14. She started having word-finding difficulties followed by a convulsion. She recalls some right-sided weakness after. She had a second seizure on 09/12/14 again with speech arrest and right arm extension. Seizure concerning for partial seizure arising from the left hemisphere. Brain MRI normal. Her routine and 24-hour EEG show occasional focal slowing over the bilateral temporal regions, left greater than right, no epileptiform discharges.She continues to do well with no convulsions since 2016, no speech difficulties since 2018. Continue Lamictal XR 150mg  daily (taking 100mg  tab + 50mg  tab).She will contact our office in April when she needs her prescription sent to the local pharmacy. Continue working on sleep hygiene. She is [place] of [place] and [place]. Follow-up in 1 year, call for any changes.     Thank you for allowing me to participate in her care.  Please do not hesitate to call for any questions or concerns.    , M.D.    CC: , NP      Electronically signed by May, MD at 05/04/2022  3:43 PM EST

## 2022-05-04 NOTE — Progress Notes (Signed)
NEUROLOGY FOLLOW UP OFFICE NOTE  Destiny Carter [id number] Jun 09, 1960  HISTORY OF PRESENT ILLNESS: I had the pleasure of seeing Destiny Carter in follow-up in the neurology clinic on 05/04/2022.  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 She is alone in the office today. Records and images were personally reviewed where available.  Since her last visit, she continues to do well with no convulsions since 2016, no episodes of speech difficulties since 2018. She is on Lamotrigine XR 150mg  daily without side effects. No staring/unresponsive episodes, gaps in time, olfactory/gustatory hallucinations, focal numbness/tingling/weakness, myoclonic jerks. 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 Every time her mother coughs, she wakes up. She only got 3 hours of sleep last night and has a headache currently. Otherwise she does not usually have headaches or dizziness. BP today 164/93, but when she checks at home, it is not terrible.    Seizure History: This is a very pleasant 61 yo RH woman with a history of hypertension, in her usual state of health until 04/04/2014 when she had a witnessed convulsion. She was sitting in a car parked waiting for a friend, speaking to her friend in Spanish (not her native language) when she could not think of the word she wanted to say. She has no further recollection of events, she was told that her eyes went back, face contorted, then she started having a generalized tonic-clonic seizure, banging her right leg on the door. This lasted for 2 minutes then she slumped to the left side. Her friend tried to pull her up then she started having another convulsion. No tongue bite or incontinence. When EMS arrived, she apparently got out of the car, grabbed her bags and walked into the ambulance, but has no recollection of this. She was told she was  repeating herself in the ambulance and speaking in 13/03/2014.  She recalls her right side was weaker after the seizure, but was unsure if this was due to swelling from hitting the door with her right side. Her whole body was sore and right ankle and left knee were swollen from hitting the car. She was brought to [facility] ER where CBC, CMP, urine drug screen were unremarkable. I personally reviewed head CT which was normal. She wanted to hold off on medication at that time.  She had a second seizure on 09/12/14. She states that she was feeling well, sitting in the passenger seat of a car, when she stopped talking mid-sentence and turned to her husband, stating "I am going to have a seizure." She felt she could not verbalize, and saw bright lights like she was passing through trees, then lost awareness. Her husband reports she became quiet, then moaned, her right arm went straight up, followed by a generalized convulsion lasting 5 minutes. She was drooling after and not making sense. No focal weakness noted, she walked out of the car. When she regained awareness, she had a terrible headache and felt very tired and sleepy. She started Lamictal XR.  Epilepsy Risk Factors:  She had a normal birth and early development.  There is no history of febrile convulsions, CNS infections such as meningitis/encephalitis, significant traumatic brain injury, neurosurgical procedures, or family history of seizures.  Diagnostic data: I personally reviewed MRI brain with and without contrast which was normal, hippocampi symmetric. Her routine and 24-hour EEG showed occasional focal slowing  over the bilateral temporal regions, left greater than right, no epileptiform discharges seen. Typical events not captured.   PAST MEDICAL HISTORY: Past Medical History:  Diagnosis Date   Asthma    Hypertension     MEDICATIONS: Current Outpatient Medications on File Prior to Visit  Medication Sig Dispense Refill   EPIPEN 2-PAK 0.3 MG/0.3ML  SOAJ injection Reported on 07/16/2015  3   hydrochlorothiazide (HYDRODIURIL) 25 MG tablet Take 25 mg by mouth daily.     LAMICTAL XR 100 MG 24 hour tablet Take 1 tablet daily (Take with Lamictal XR 50mg  tablet for total dose of 150mg  daily) 90 tablet 3   LAMICTAL XR 50 MG 24 hour tablet Take 1 tablet daily (Take with Lamictal XR 100mg  tablet for total dose of 150mg  daily) 90 tablet 3   levalbuterol (XOPENEX HFA) 45 MCG/ACT inhaler Inhale into the lungs. Every 4 hrs prn     telmisartan (MICARDIS) 80 MG tablet Take 80 mg by mouth daily.     No current facility-administered medications on file prior to visit.    ALLERGIES: Allergies  Allergen Reactions   Other Anaphylaxis    Chili peppers, has epi pen   Keflex [Cephalexin] Other (See Comments)   Macrolides And Ketolides Itching and Rash   Erythromycin Itching and Rash   Latex Rash    FAMILY HISTORY: Family History  Problem Relation Age of Onset   Cancer Father    Cancer Mother     SOCIAL HISTORY: Social History   Socioeconomic History   Marital status: Married    Spouse name: Not on file   Number of children: Not on file   Years of education: Not on file   Highest education level: Not on file  Occupational History   Not on file  Tobacco Use   Smoking status: Never   Smokeless tobacco: Never  Vaping Use   Vaping Use: Never used  Substance and Sexual Activity   Alcohol use: Yes    Comment: 3-4 Times weekly   Drug use: No   Sexual activity: Not on file  Other Topics Concern   Not on file  Social History Narrative   Right handed    Social Determinants of Health   Financial Resource Strain: Not on file  Food Insecurity: Not on file  Transportation Needs: Not on file  Physical Activity: Not on file  Stress: Not on file  Social Connections: Not on file  Intimate Partner Violence: Not on file     PHYSICAL EXAM: Vitals:   05/04/22 1457  BP: (!) 164/93  Pulse: 93  SpO2: 98%   General: No acute  distress Head:  Normocephalic/atraumatic Skin/Extremities: No rash, no edema Neurological Exam: alert and awake. No aphasia or dysarthria. Fund of knowledge is appropriate.  Attention and concentration are normal.   Cranial nerves: Pupils equal, round. Extraocular movements intact with no nystagmus. Visual fields full.  No facial asymmetry.  Motor: Bulk and tone normal, muscle strength 5/5 throughout with no pronator drift.   Finger to nose testing intact.  Gait narrow-based and steady, able to tandem walk adequately.  Romberg negative.   IMPRESSION: This is a very pleasant 61 yo RH woman with a history of hypertension, with new onset seizure last 04/04/14. She started having word-finding difficulties followed by a convulsion. She recalls some right-sided weakness after. She had a second seizure on 09/12/14 again with speech arrest and right arm extension. Seizure concerning for partial seizure arising from the left hemisphere. Brain MRI  normal. Her routine and 24-hour EEG show occasional focal slowing over the bilateral temporal regions, left greater than right, no epileptiform discharges.She continues to do well with no convulsions since 2016, no speech difficulties since 2018. Continue Lamictal XR 150mg  daily (taking 100mg  tab + 50mg  tab).She will contact our office in April when she needs her prescription sent to the local pharmacy. Continue working on sleep hygiene. She is aware of [place] and [place]. Follow-up in 1 year, call for any changes.    Thank you for allowing me to participate in her care.  Please do not hesitate to call for any questions or concerns.    , M.D.   CC: , NP

## 2022-05-11 ENCOUNTER — Encounter: Payer: Self-pay | Admitting: Neurology

## 2022-05-26 ENCOUNTER — Other Ambulatory Visit: Payer: Self-pay

## 2022-05-26 ENCOUNTER — Telehealth: Payer: Self-pay | Admitting: Anesthesiology

## 2022-05-26 ENCOUNTER — Inpatient Hospital Stay: Admit: 2022-05-26

## 2022-05-26 MED ORDER — LAMICTAL XR 50 MG PO TB24: ORAL_TABLET | ORAL | 3 refills | Status: DC

## 2022-05-26 MED ORDER — LAMICTAL XR 100 MG PO TB24: ORAL_TABLET | ORAL | 3 refills | Status: DC

## 2022-05-26 NOTE — Telephone Encounter (Signed)
Pt called stating that the [facility] program is not accepting the Rx the patient has. Pharmacy wants a Rx sent from the doctors office directly. [place] Patient Assistance Fax [phone]. Pt's ID [id number]. Is for 2 Rx's Lamictal XR 100 mg and Lamictal XR 50 mg.

## 2022-05-26 NOTE — Telephone Encounter (Signed)
RX printed and faxed to [facility]

## 2022-06-23 ENCOUNTER — Encounter

## 2022-09-07 ENCOUNTER — Telehealth: Payer: Self-pay | Admitting: Anesthesiology

## 2022-09-07 NOTE — Telephone Encounter (Signed)
Formatting of this note might be different from the original.  Pt left message stating she has a question regarding her Lamictal Rx.   Electronically signed by Reatha Armour, CMA at 09/07/2022  1:57 PM EDT

## 2022-09-07 NOTE — Telephone Encounter (Signed)
Pt left message stating she has a question regarding her Lamictal Rx.

## 2022-09-08 MED ORDER — LAMICTAL XR 100 MG PO TB24: ORAL_TABLET | ORAL | 3 refills | Status: DC

## 2022-09-08 MED ORDER — LAMICTAL XR 50 MG PO TB24: ORAL_TABLET | ORAL | 3 refills | Status: DC

## 2022-09-08 NOTE — Telephone Encounter (Signed)
Pls let her know I sent in the prescriptions, thanks

## 2022-09-08 NOTE — Telephone Encounter (Signed)
Formatting of this note might be different from the original.  Pls let her know I sent in the prescriptions, thanks  Electronically signed by Van Clines, MD at 09/08/2022 11:44 AM EDT

## 2022-09-08 NOTE — Telephone Encounter (Signed)
Formatting of this note might be different from the original.  Called and left message per Dr. Karel Jarvis.   Electronically signed by Lenise Herald, LPN at 16/02/9603  2:13 PM EDT

## 2022-09-08 NOTE — Telephone Encounter (Signed)
Called and left message per Dr. Karel Jarvis.

## 2022-10-27 ENCOUNTER — Encounter: Payer: Self-pay | Admitting: Neurology

## 2022-10-27 ENCOUNTER — Telehealth: Payer: Self-pay | Admitting: Neurology

## 2022-10-27 ENCOUNTER — Inpatient Hospital Stay: Admit: 2022-10-27

## 2022-10-27 NOTE — Telephone Encounter (Signed)
Pt left a message on the VM stating she needs to speak to a nurse about her medication

## 2022-10-28 MED ORDER — LAMOTRIGINE ER 100 MG PO TB24: ORAL_TABLET | ORAL | 3 refills | Status: DC

## 2022-10-28 MED ORDER — LAMOTRIGINE ER 50 MG PO TB24: ORAL_TABLET | ORAL | 3 refills | Status: DC

## 2022-10-28 NOTE — Telephone Encounter (Signed)
Rx sent for Lamotrigine ER 100mg  and 50mg  tablets: take 150mg  daily. Thanks

## 2022-10-28 NOTE — Telephone Encounter (Signed)
She is going to change it to do the generic and send to this pharmacy  [id number] [place], [place] - [address] Phone: [phone]  Fax: [phone]

## 2023-04-27 ENCOUNTER — Inpatient Hospital Stay: Admit: 2023-04-27

## 2023-05-03 ENCOUNTER — Telehealth: Payer: Self-pay | Admitting: Neurology

## 2023-05-03 ENCOUNTER — Encounter: Payer: Self-pay | Admitting: Neurology

## 2023-05-03 ENCOUNTER — Ambulatory Visit (INDEPENDENT_AMBULATORY_CARE_PROVIDER_SITE_OTHER): Payer: No Typology Code available for payment source | Admitting: Neurology

## 2023-05-03 VITALS — BP 148/83 | HR 74 | Ht 65.5 in | Wt 182.6 lb

## 2023-05-03 DIAGNOSIS — G40009 Localization-related (focal) (partial) idiopathic epilepsy and epileptic syndromes with seizures of localized onset, not intractable, without status epilepticus: Secondary | ICD-10-CM | POA: Diagnosis not present

## 2023-05-03 MED ORDER — LAMOTRIGINE ER 100 MG PO TB24: ORAL_TABLET | ORAL | 3 refills | Status: DC

## 2023-05-03 MED ORDER — LAMOTRIGINE ER 50 MG PO TB24: ORAL_TABLET | ORAL | 3 refills | Status: DC

## 2023-05-03 NOTE — Patient Instructions (Signed)
Always good to see you. I wish you all the best. Continue Lamotrigine ER 150mg  daily. Follow-up in 1 year, call for any changes.   Seizure Precautions: 1. If medication has been prescribed for you to prevent seizures, take it exactly as directed.  Do not stop taking the medicine without talking to your doctor first, even if you have not had a seizure in a long time.   2. Avoid activities in which a seizure would cause danger to yourself or to others.  Don't operate dangerous machinery, swim alone, or climb in high or dangerous places, such as on ladders, roofs, or girders.  Do not drive unless your doctor says you may.  3. If you have any warning that you may have a seizure, lay down in a safe place where you can't hurt yourself.    4.  No driving for 6 months from last seizure, as per [facility].   Please refer to the following link on the Epilepsy Foundation of America's website for more information: http://www.epilepsyfoundation.org/answerplace/Social/driving/drivingu.cfm   5.  Maintain good sleep hygiene. Avoid alcohol.  6.  Contact your doctor if you have any problems that may be related to the medicine you are taking.  7.  Call 911 and bring the patient back to the ED if:        A.  The seizure lasts longer than 5 minutes.       B.  The patient doesn't awaken shortly after the seizure  C.  The patient has new problems such as difficulty seeing, speaking or moving  D.  The patient was injured during the seizure  E.  The patient has a temperature over 102 F (39C)  F.  The patient vomited and now is having trouble breathing

## 2023-05-03 NOTE — Telephone Encounter (Signed)
Pt brought in DMV forms. She would like them to be faxed and she would like a copy mailed to her.

## 2023-05-03 NOTE — Progress Notes (Signed)
NEUROLOGY FOLLOW UP OFFICE NOTE  Destiny Carter [id number] 29-Nov-1960  HISTORY OF PRESENT ILLNESS: I had the pleasure of seeing Destiny Carter in follow-up in the neurology clinic on 05/03/2023.  She is alone in the office today. 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 Since her last visit, she continues to do well with no convulsions since 2016, no episodes of speech difficulties since 2018. She is on Lamotrigine ER 150mg  daily without side effects. She denies any staring/unresponsive episodes, gaps in time, olfactory/gustatory hallucinations, focal numbness/tingling/weakness, myoclonic jerks. No headaches, dizziness, vision changes, no falls. Sleep and mood/stress levels affected by being one of the primary caregivers for her mother, some nights are worse than others.    Seizure History: This is a very pleasant 62 yo RH woman with a history of hypertension, in her usual state of health until 04/04/2014 when she had a witnessed convulsion. She was sitting in a car parked waiting for a friend, speaking to her friend in Spanish (not her native language) when she could not think of the word she wanted to say. She has no further recollection of events, she was told that her eyes went back, face contorted, then she started having a generalized tonic-clonic seizure, banging her right leg on the door. This lasted for 2 minutes then she slumped to the left side. Her friend tried to pull her up then she started having another convulsion. No tongue bite or incontinence. When EMS arrived, she apparently got out of the car, grabbed her bags and walked into the ambulance, but has no recollection of this. She was told she was repeating herself in the ambulance and speaking in [place].  She recalls her right side was weaker after the seizure, but was unsure if this was due to swelling from hitting the door with her right side. Her whole  body was sore and right ankle and left knee were swollen from hitting the car. She was brought to [facility] ER where CBC, CMP, urine drug screen were unremarkable. I personally reviewed head CT which was normal. She wanted to hold off on medication at that time.  She had a second seizure on 09/12/14. She states that she was feeling well, sitting in the passenger seat of a car, when she stopped talking mid-sentence and turned to her husband, stating "I am going to have a seizure." She felt she could not verbalize, and saw bright lights like she was passing through trees, then lost awareness. Her husband reports she became quiet, then moaned, her right arm went straight up, followed by a generalized convulsion lasting 5 minutes. She was drooling after and not making sense. No focal weakness noted, she walked out of the car. When she regained awareness, she had a terrible headache and felt very tired and sleepy. She started Lamictal XR.  Epilepsy Risk Factors:  She had a normal birth and early development.  There is no history of febrile convulsions, CNS infections such as meningitis/encephalitis, significant traumatic brain injury, neurosurgical procedures, or family history of seizures.  Diagnostic data: I personally reviewed MRI brain with and without contrast which was normal, hippocampi symmetric. Her routine and 24-hour EEG showed occasional focal slowing over the bilateral temporal regions, left greater than right, no epileptiform discharges seen. Typical events not captured.    PAST MEDICAL HISTORY: Past Medical History:  Diagnosis Date   Asthma    Hypertension     MEDICATIONS:  Current Outpatient Medications on File Prior to Visit  Medication Sig Dispense Refill   b complex vitamins capsule Take 1 capsule by mouth daily.     cholecalciferol (VITAMIN D3) 25 MCG (1000 UNIT) tablet Take 1,000 Units by mouth daily.     EPIPEN 2-PAK 0.3 MG/0.3ML SOAJ injection Reported on 07/16/2015  3    hydrochlorothiazide (HYDRODIURIL) 25 MG tablet Take 25 mg by mouth daily.     lamoTRIgine (LAMICTAL XR) 100 MG 24 hour tablet Take 1 tablet daily (Take with 50mg  tablet for total daily dose of 150mg  daily) 90 tablet 3   lamoTRIgine (LAMICTAL XR) 50 MG 24 hour tablet Take 1 tablet daily (Take with 100mg  tablet for total daily dose of 150mg  daily) 90 tablet 3   levalbuterol (XOPENEX HFA) 45 MCG/ACT inhaler Inhale into the lungs. Every 4 hrs prn     telmisartan (MICARDIS) 80 MG tablet Take 80 mg by mouth daily.     No current facility-administered medications on file prior to visit.    ALLERGIES: Allergies  Allergen Reactions   Other Anaphylaxis    Chili peppers, has epi pen   Keflex [Cephalexin] Other (See Comments)   Macrolides And Ketolides Itching and Rash   Erythromycin Itching and Rash   Latex Rash    FAMILY HISTORY: Family History  Problem Relation Age of Onset   Cancer Father    Cancer Mother     SOCIAL HISTORY: Social History   Socioeconomic History   Marital status: Married    Spouse name: Not on file   Number of children: Not on file   Years of education: Not on file   Highest education level: Not on file  Occupational History   Not on file  Tobacco Use   Smoking status: Never   Smokeless tobacco: Never  Vaping Use   Vaping status: Never Used  Substance and Sexual Activity   Alcohol use: Yes    Comment: 3-4 Times weekly wine   Drug use: No   Sexual activity: Not on file  Other Topics Concern   Not on file  Social History Narrative   Right handed    Social Determinants of Health   Financial Resource Strain: Not on file  Food Insecurity: Not on file  Transportation Needs: Not on file  Physical Activity: Not on file  Stress: Not on file  Social Connections: Unknown (10/06/2021)   [facility]   Social Network    Social Network: Not on file  Intimate Partner Violence: Unknown (08/27/2021)   Received from [facility]   HITS    Physically Hurt: Not on file    Insult or Talk Down To: Not on file    Threaten Physical Harm: Not on file    Scream or Curse: Not on file     PHYSICAL EXAM: Vitals:   05/03/23 1503  BP: (!) 148/83  Pulse: 74  SpO2: 100%   General: No acute distress Head:  Normocephalic/atraumatic Skin/Extremities: No rash, no edema Neurological Exam: alert and awake. No aphasia or dysarthria. Fund of knowledge is appropriate. Attention and concentration are normal.   Cranial nerves: Pupils equal, round. Extraocular movements intact with no nystagmus. Visual fields full.  No facial asymmetry.  Motor: Bulk and tone normal, muscle strength 5/5 throughout with no pronator drift.   Finger to nose testing intact.  Gait narrow-based and steady, able to tandem walk adequately.  Romberg negative.   IMPRESSION: This is a very  pleasant 62 yo RH woman with a history of hypertension, with new onset seizure last 04/04/14. She started having word-finding difficulties followed by a convulsion. She recalls some right-sided weakness after. She had a second seizure on 09/12/14 again with speech arrest and right arm extension. Seizure concerning for partial seizure arising from the left hemisphere. Brain MRI normal. Her routine and 24-hour EEG show occasional focal slowing over the bilateral temporal regions, left greater than right, no epileptiform discharges. She continues to do well with no convulsions since 2016, no speech difficulties since 2018. Continue Lamotrigine ER 150mg  daily (taking 100mg  + 50 mg tab). She is aware of [place] and [place], DMV paperwork will be filled out. Follow-up in 1 year, call for any changes.    Thank you for allowing me to participate in her care.  Please do not hesitate to call for any questions or concerns.    Patrcia Dolly, M.D.   CC: Lennox Grumbles, [place]

## 2023-05-10 DIAGNOSIS — Z0279 Encounter for issue of other medical certificate: Secondary | ICD-10-CM

## 2023-06-15 ENCOUNTER — Inpatient Hospital Stay: Admit: 2023-06-15

## 2024-01-20 ENCOUNTER — Other Ambulatory Visit: Payer: Self-pay

## 2024-01-20 ENCOUNTER — Telehealth: Payer: Self-pay | Admitting: Neurology

## 2024-01-20 MED ORDER — LAMOTRIGINE ER 50 MG PO TB24: ORAL_TABLET | ORAL | 3 refills | Status: DC

## 2024-01-20 NOTE — Telephone Encounter (Signed)
 Pharmacy calling for refill lamoTRIgine  (LAMICTAL  XR) 50 MG 24 hour tablet

## 2024-04-24 ENCOUNTER — Ambulatory Visit (INDEPENDENT_AMBULATORY_CARE_PROVIDER_SITE_OTHER): Payer: No Typology Code available for payment source | Admitting: Neurology

## 2024-04-24 ENCOUNTER — Encounter: Payer: Self-pay | Admitting: Neurology

## 2024-04-24 VITALS — BP 155/84 | HR 75 | Ht 65.5 in | Wt 188.4 lb

## 2024-04-24 DIAGNOSIS — G40009 Localization-related (focal) (partial) idiopathic epilepsy and epileptic syndromes with seizures of localized onset, not intractable, without status epilepticus: Secondary | ICD-10-CM | POA: Diagnosis not present

## 2024-04-24 DIAGNOSIS — R2689 Other abnormalities of gait and mobility: Secondary | ICD-10-CM | POA: Diagnosis not present

## 2024-04-24 MED ORDER — LAMOTRIGINE ER 50 MG PO TB24: ORAL_TABLET | ORAL | 4 refills | Status: AC

## 2024-04-24 MED ORDER — LAMOTRIGINE ER 100 MG PO TB24: ORAL_TABLET | ORAL | 4 refills | Status: AC

## 2024-04-24 NOTE — Progress Notes (Signed)
 NEUROLOGY FOLLOW UP OFFICE NOTE  Destiny Carter [id number] Oct 17, 1960  Discussed the use of AI scribe software for clinical note transcription with the patient, who gave verbal consent to proceed.  History of Present Illness I had the pleasure of seeing Destiny Carter in follow-up in the neurology clinic on 04/24/2024.  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  She is alone in the office today. Records and images were personally reviewed where available. Since her last visit, she continues to do well with no convulsions since 2016, no episodes of speech difficulties since 2018 on Lamotrigine  ER 150mg  daily (100mg +50mg ) without side effects. She denies any staring/unresponsive episodes, gaps in time, olfactory/gustatory hallucinations, focal numbness/tingling/weakness, myoclonic jerks. No headaches, dizziness, vision changes. She gets around 7 hours of sleep at night. Mood is good.   She has experienced three falls this year, with the first occurring in February and two more in September. These falls were attributed to specific incidents. She feels her balance has been off since the first fall, noting that it is her right foot consistently gets caught and she tips over when standing on her left foot. She denies numbness, tingling, neck pain, and back pain.  She reports chronic low levels of B vitamins and is scheduled for a blood draw next week to check her thyroid level. She experiences pain in her right calf, which has improved with magnesium supplementation.     Seizure History: This is a very pleasant 63 yo RH woman with a history of hypertension, in her usual state of health until 04/04/2014 when she had a witnessed convulsion. She was sitting in a car parked waiting for a friend, speaking to her friend in Spanish (not her native language) when she could not think of the word she wanted to say. She has no further  recollection of events, she was told that her eyes went back, face contorted, then she started having a generalized tonic-clonic seizure, banging her right leg on the door. This lasted for 2 minutes then she slumped to the left side. Her friend tried to pull her up then she started having another convulsion. No tongue bite or incontinence. When EMS arrived, she apparently got out of the car, grabbed her bags and walked into the ambulance, but has no recollection of this. She was told she was repeating herself in the ambulance and speaking in Spanish.  She recalls her right side was weaker after the seizure, but was unsure if this was due to swelling from hitting the door with her right side. Her whole body was sore and right ankle and left knee were swollen from hitting the car. She was brought to [facility] ER where CBC, CMP, urine drug screen were unremarkable. I personally reviewed head CT which was normal. She wanted to hold off on medication at that time.  She had a second seizure on 09/12/14. She states that she was feeling well, sitting in the passenger seat of a car, when she stopped talking mid-sentence and turned to her husband, stating I am going to have a seizure. She felt she could not verbalize, and saw bright lights like she was passing through trees, then lost awareness. Her husband reports she became quiet, then moaned, her right arm went straight up, followed by a generalized convulsion lasting 5 minutes. She was drooling after and not making sense. No focal weakness noted, she walked out of the car. When  she regained awareness, she had a terrible headache and felt very tired and sleepy. She started Lamictal  XR.  Epilepsy Risk Factors:  She had a normal birth and early development.  There is no history of febrile convulsions, CNS infections such as meningitis/encephalitis, significant traumatic brain injury, neurosurgical procedures, or family history of seizures.  Diagnostic data: I personally  reviewed MRI brain with and without contrast which was normal, hippocampi symmetric. Her routine and 24-hour EEG showed occasional focal slowing over the bilateral temporal regions, left greater than right, no epileptiform discharges seen. Typical events not captured.   PAST MEDICAL HISTORY: Past Medical History:  Diagnosis Date   Asthma    Hypertension     MEDICATIONS: Current Outpatient Medications on File Prior to Visit  Medication Sig Dispense Refill   b complex vitamins capsule Take 1 capsule by mouth daily.     cholecalciferol (VITAMIN D3) 25 MCG (1000 UNIT) tablet Take 1,000 Units by mouth daily.     EPIPEN 2-PAK 0.3 MG/0.3ML SOAJ injection Reported on 07/16/2015  3   hydrochlorothiazide (HYDRODIURIL) 25 MG tablet Take 25 mg by mouth daily.     lamoTRIgine  (LAMICTAL  XR) 100 MG 24 hour tablet Take 1 tablet daily (Take with 50mg  tablet for total daily dose of 150mg  daily) 90 tablet 3   lamoTRIgine  (LAMICTAL  XR) 50 MG 24 hour tablet Take 1 tablet daily (Take with 100mg  tablet for total daily dose of 150mg  daily) 90 tablet 3   levalbuterol (XOPENEX HFA) 45 MCG/ACT inhaler Inhale into the lungs. Every 4 hrs prn     telmisartan (MICARDIS) 80 MG tablet Take 80 mg by mouth daily.     No current facility-administered medications on file prior to visit.    ALLERGIES: Allergies  Allergen Reactions   Other Anaphylaxis    Chili peppers, has epi pen   Keflex [Cephalexin] Other (See Comments)   Macrolides And Ketolides Itching and Rash   Erythromycin Itching and Rash   Latex Rash    FAMILY HISTORY: Family History  Problem Relation Age of Onset   Cancer Father    Cancer Mother     SOCIAL HISTORY: Social History   Socioeconomic History   Marital status: Married    Spouse name: Not on file   Number of children: Not on file   Years of education: Not on file   Highest education level: Not on file  Occupational History   Not on file  Tobacco Use   Smoking status: Never    Smokeless tobacco: Never  Vaping Use   Vaping status: Never Used  Substance and Sexual Activity   Alcohol use: Yes    Comment: 3-4 Times weekly wine   Drug use: No   Sexual activity: Not on file  Other Topics Concern   Not on file  Social History Narrative   Right handed    Social Drivers of Health   Financial Resource Strain: Low Risk (02/03/2024)   Received from [facility]   Overall Financial Resource Strain (CARDIA)    Difficulty of Paying Living Expenses: Not hard at all  Food Insecurity: No Food Insecurity (02/03/2024)   Received from [facility]   Hunger Vital Sign    Within the past 12 months, you worried that your food would run out before you got the money to buy more.: Never true    Within the past 12 months, the food you bought just didn't last and you didn't have money to get more.: Never true  Transportation  Needs: No Transportation Needs (02/03/2024)   [facility] - [facility]    Lack of Transportation (Medical): No    Lack of Transportation (Non-Medical): No  Physical Activity: Insufficiently Active (02/03/2024)   Received from [facility]   Exercise Vital Sign    On average, how many days per week do you engage in moderate to strenuous exercise (like a brisk walk)?: 1 day    On average, how many minutes do you engage in exercise at this level?: 50 min  Stress: Stress Concern Present (02/03/2024)   Received from [facility] - Occupational Stress Questionnaire    Feeling of Stress : To some extent  Social Connections: Socially Integrated (02/03/2024)   Received from [facility]   Social Connection and Isolation Panel    In a typical week, how many times do you talk on the phone with family, friends, or neighbors?: More than three times a week    How often do you get together with friends or relatives?: More than three times a week    How often do you attend church or religious  services?: More than 4 times per year    Do you belong to any clubs or organizations such as church groups, unions, fraternal or athletic groups, or school groups?: Yes    How often do you attend meetings of the clubs or organizations you belong to?: More than 4 times per year    Are you married, widowed, divorced, separated, never married, or living with a partner?: Married  Intimate Partner Violence: Not At Risk (02/03/2024)   Received from [facility]   Humiliation, Afraid, Rape, and Kick questionnaire    Within the last year, have you been afraid of your partner or ex-partner?: No    Within the last year, have you been humiliated or emotionally abused in other ways by your partner or ex-partner?: No    Within the last year, have you been kicked, hit, slapped, or otherwise physically hurt by your partner or ex-partner?: No    Within the last year, have you been raped or forced to have any kind of sexual activity by your partner or ex-partner?: No     PHYSICAL EXAM: Vitals:   04/24/24 1508  BP: (!) 155/84  Pulse: 75  SpO2: 99%   General: No acute distress Head:  Normocephalic/atraumatic Skin/Extremities: No rash, no edema Neurological Exam: alert and awake. No aphasia or dysarthria. Fund of knowledge is appropriate.   Attention and concentration are normal.   Cranial nerves: Pupils equal, round. Extraocular movements intact with no nystagmus. Visual fields full.  No facial asymmetry.  Motor: Bulk and tone normal, muscle strength 5/5 throughout with no pronator drift. Sensation intact to cold, pin, vibration sense. Reflexes +2 throughout. Toes downgoing, feels it more on the left foot. Finger to nose testing intact.  Gait narrow-based and steady, able to tandem walk adequately.  Romberg negative.  Assessment & Plan This is a very pleasant 63 yo RH woman with a history of hypertension, with new onset seizure last 04/04/14. She started having word-finding difficulties followed by a  convulsion. She recalls some right-sided weakness after. She had a second seizure on 09/12/14 again with speech arrest and right arm extension. Seizure concerning for partial seizure arising from the left hemisphere. Brain MRI normal. Her routine and 24-hour EEG show occasional focal slowing over the bilateral temporal regions, left greater than right, no epileptiform discharges. No  convulsions since 2016, no speech difficulties since 2018. Continue Lamotrigine  ER 150mg  daily, refills sent. She is aware of [place] driving laws to stop driving after a seizure until 6 months seizure-free.   Balance impairment with recurrent falls Balance impairment with three falls this year. Exam today normal. - Consider working with a systems analyst at a gym for balance exercises. - If gym option is not feasible, will order physical therapy for balance therapy. - Ensure B12 levels are checked during upcoming blood draw.  Follow-up in 1 year, call for any changes.    Thank you for allowing me to participate in her care.  Please do not hesitate to call for any questions or concerns.    Darice Shivers, M.D.   CC: Marko Like, [place]

## 2024-04-24 NOTE — Patient Instructions (Signed)
 It's always a pleasure to see you! Continue Lamotrigine  ER 150mg  daily.  See if you can get set up with a personal trainer at the gym for balance exercises, if not we can order Balance therapy with physical therapy  Follow-up in 1 year, call for any changes.   Seizure Precautions: 1. If medication has been prescribed for you to prevent seizures, take it exactly as directed.  Do not stop taking the medicine without talking to your doctor first, even if you have not had a seizure in a long time.   2. Avoid activities in which a seizure would cause danger to yourself or to others.  Don't operate dangerous machinery, swim alone, or climb in high or dangerous places, such as on ladders, roofs, or girders.  Do not drive unless your doctor says you may.  3. If you have any warning that you may have a seizure, lay down in a safe place where you can't hurt yourself.    4.  No driving for 6 months from last seizure, as per [place]  state law.   Please refer to the following link on the [employer]'s website for more information: http://www.epilepsyfoundation.org/answerplace/Social/driving/drivingu.cfm   5.  Maintain good sleep hygiene. Avoid alcohol.  6.  Contact your doctor if you have any problems that may be related to the medicine you are taking.  7.  Call 911 and bring the patient back to the ED if:        A.  The seizure lasts longer than 5 minutes.       B.  The patient doesn't awaken shortly after the seizure  C.  The patient has new problems such as difficulty seeing, speaking or moving  D.  The patient was injured during the seizure  E.  The patient has a temperature over 102 F (39C)  F.  The patient vomited and now is having trouble breathing

## 2024-04-27 ENCOUNTER — Encounter

## 2024-05-09 ENCOUNTER — Inpatient Hospital Stay: Admit: 2024-05-09 | Payer: PRIVATE HEALTH INSURANCE

## 2024-05-09 MED ORDER — ALBUTEROL SULFATE (2.5 MG/3ML) 0.083% IN NEBU
Freq: Once | RESPIRATORY_TRACT | Status: AC
Start: 2024-05-09 — End: 2024-05-09
  Administered 2024-05-09: 16:00:00 2.5 mg via RESPIRATORY_TRACT

## 2025-04-30 ENCOUNTER — Ambulatory Visit: Admitting: Neurology
